# Patient Record
Sex: Male | Born: 1955 | Race: White | Hispanic: No | Marital: Married | Smoking: Former smoker
Health system: Southern US, Community
[De-identification: ages and names within clinical notes are randomized; demographics above are authoritative.]

## PROBLEM LIST (undated history)

## (undated) DIAGNOSIS — F32A Depression, unspecified: Secondary | ICD-10-CM

## (undated) DIAGNOSIS — Z9289 Personal history of other medical treatment: Secondary | ICD-10-CM

## (undated) DIAGNOSIS — E785 Hyperlipidemia, unspecified: Secondary | ICD-10-CM

## (undated) DIAGNOSIS — J45909 Unspecified asthma, uncomplicated: Secondary | ICD-10-CM

## (undated) DIAGNOSIS — F329 Major depressive disorder, single episode, unspecified: Secondary | ICD-10-CM

## (undated) DIAGNOSIS — T7840XA Allergy, unspecified, initial encounter: Secondary | ICD-10-CM

## (undated) DIAGNOSIS — K219 Gastro-esophageal reflux disease without esophagitis: Secondary | ICD-10-CM

## (undated) HISTORY — DX: Major depressive disorder, single episode, unspecified: F32.9

## (undated) HISTORY — DX: Hyperlipidemia, unspecified: E78.5

## (undated) HISTORY — PX: VASECTOMY: SHX75

## (undated) HISTORY — PX: POLYPECTOMY: SHX149

## (undated) HISTORY — DX: Personal history of other medical treatment: Z92.89

## (undated) HISTORY — PX: COLONOSCOPY: SHX174

## (undated) HISTORY — DX: Unspecified asthma, uncomplicated: J45.909

## (undated) HISTORY — DX: Gastro-esophageal reflux disease without esophagitis: K21.9

## (undated) HISTORY — PX: FRACTURE SURGERY: SHX138

## (undated) HISTORY — DX: Depression, unspecified: F32.A

## (undated) HISTORY — PX: HERNIA REPAIR: SHX51

## (undated) HISTORY — DX: Allergy, unspecified, initial encounter: T78.40XA

---

## 1978-05-07 HISTORY — PX: PILONIDAL CYST EXCISION: SHX744

## 1997-09-13 ENCOUNTER — Ambulatory Visit (HOSPITAL_COMMUNITY): Admission: RE | Admit: 1997-09-13 | Discharge: 1997-09-13 | Payer: Self-pay | Admitting: Family Medicine

## 1999-06-07 ENCOUNTER — Ambulatory Visit (HOSPITAL_BASED_OUTPATIENT_CLINIC_OR_DEPARTMENT_OTHER): Admission: RE | Admit: 1999-06-07 | Discharge: 1999-06-07 | Payer: Self-pay | Admitting: General Surgery

## 2007-09-16 IMAGING — CT CT HEAD
2 series · 15 of 30 positions shown, 19 images · IV contrast (agent unspecified)
Comparison: none

______________________________________________________________
REICH, PARGNA 04/24/1948 GABRIEL, ANAROSA [DATE]

REASON FOR CONSULTATION: R/O DIVERTICULOSIS
____________________________________________
EXAM: ABDOMEN & PELVIS WITH CONTRAST
An enhanced CT scan was obtained from the hemidiaphragms to the pubic
symphysis. The liver and spleen appear intact with no focal low density
areas identified.
Both kidneys excrete the contrast material with no masses noted in
either kidney.
The adrenal glands appear intact.
No focal masses are noted of the pancreas.
No intra-abdominal masses are visualized.
On the pelvic portion of the exam, no masses are identified to suggest
abscesses. No dirtiness to the fat is noted to suggest any inflammatory
response such as with diverticulitis. There is also noted some soft
tissue densities in the adnexal regions bilaterally which could
represent ovaries. The patient is status post hysterectomy.
 Patient REICH, PARGNA [DATE]

[Series 2: without contrast · axial · non-contrast · 0.49mm/px · z∈[+121,+271]mm · 13 of 36 slices shown, 17 images]
[im 3/36  brain]
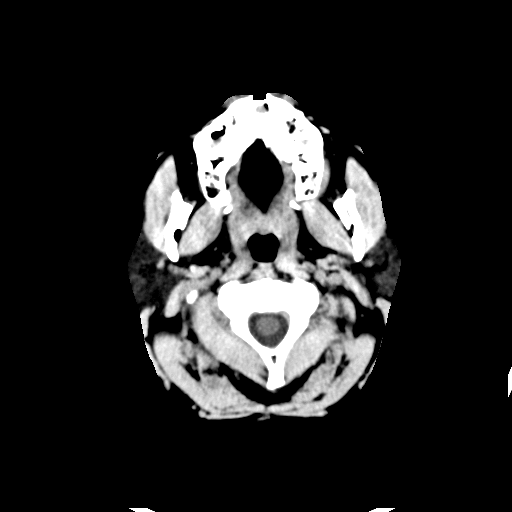
[im 3/36  bone]
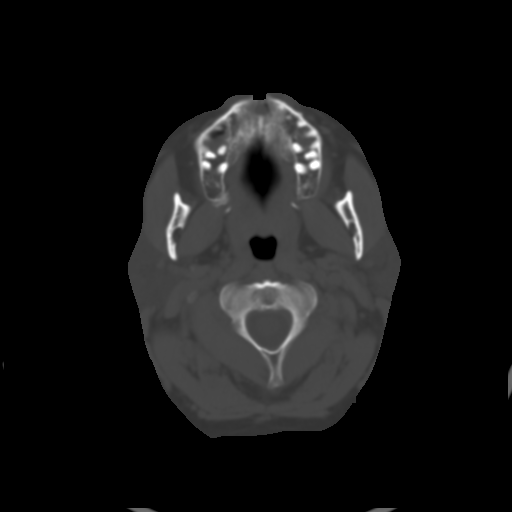
[im 6/36  brain]
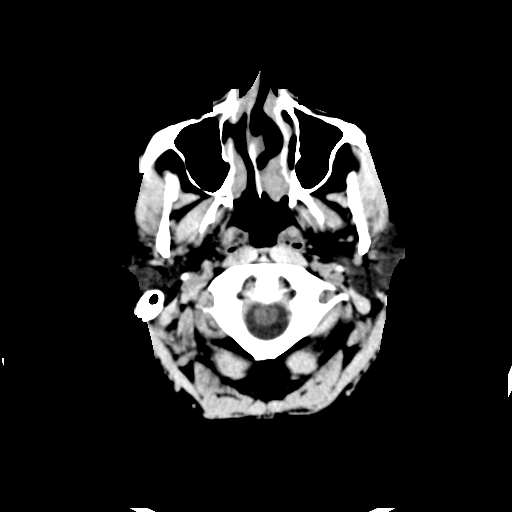
[im 8/36  brain]
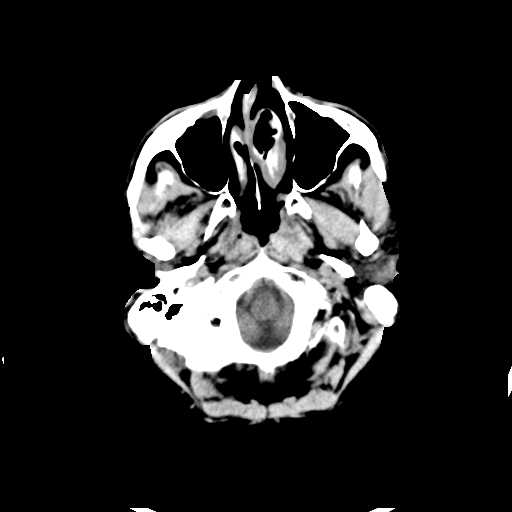
[im 11/36  brain]
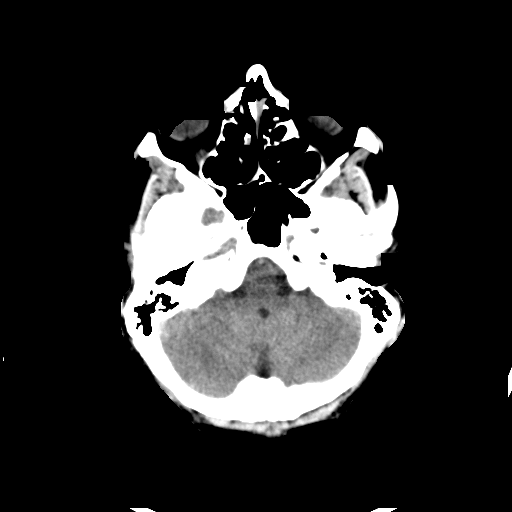
[im 13/36  brain]
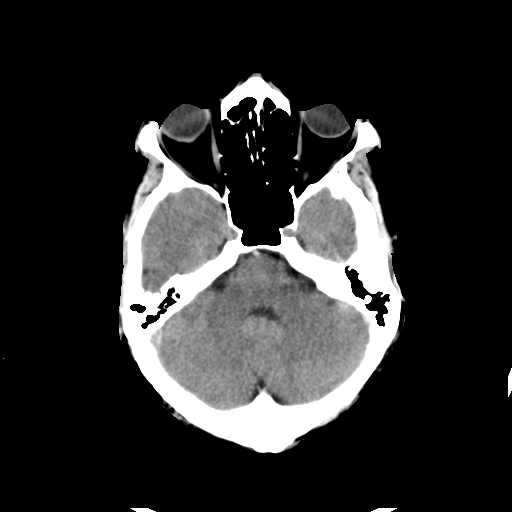
[im 13/36  bone]
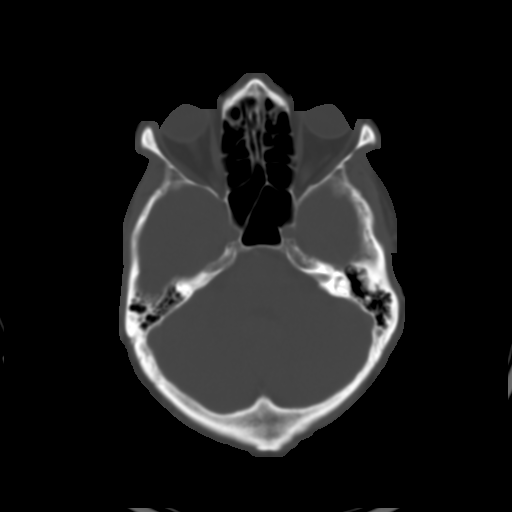
[im 16/36  brain]
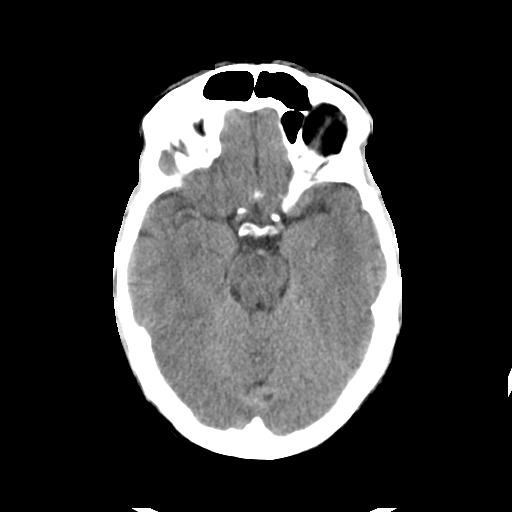
[im 18/36  brain]
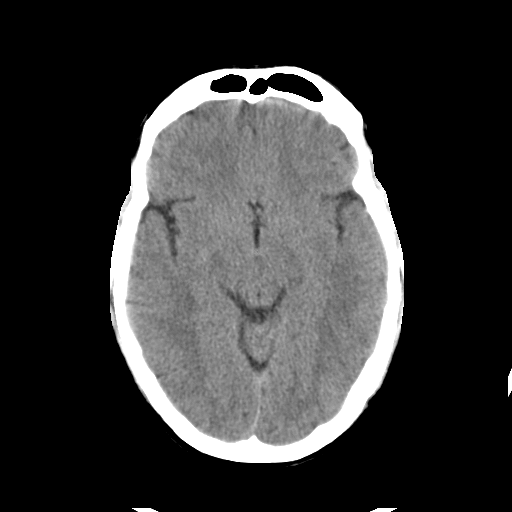
[im 21/36  brain]
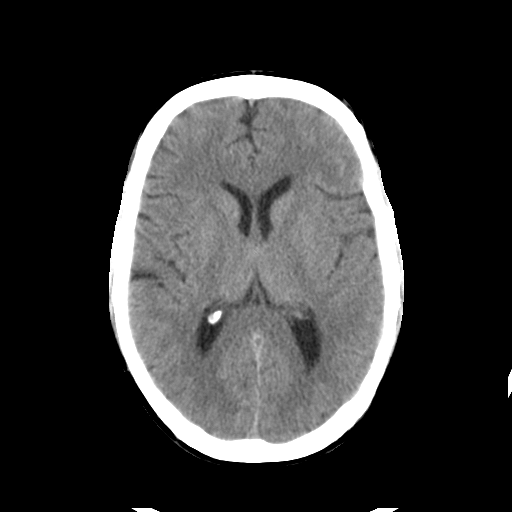
[im 23/36  brain]
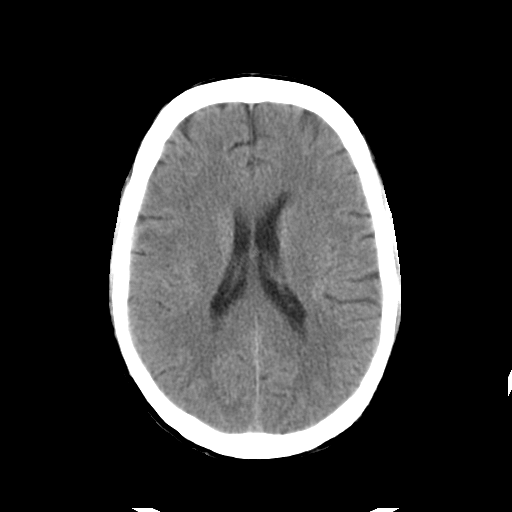
[im 23/36  bone]
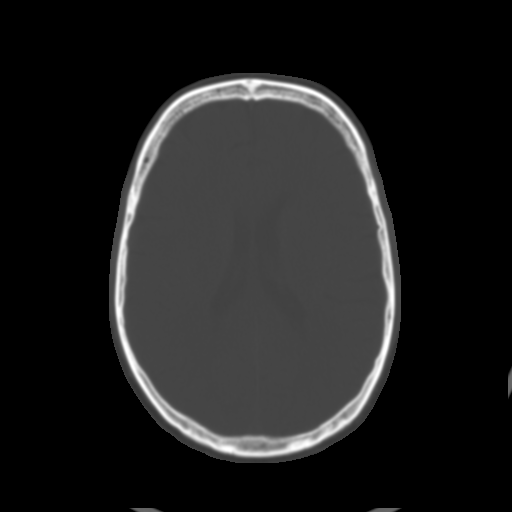
[im 26/36  brain]
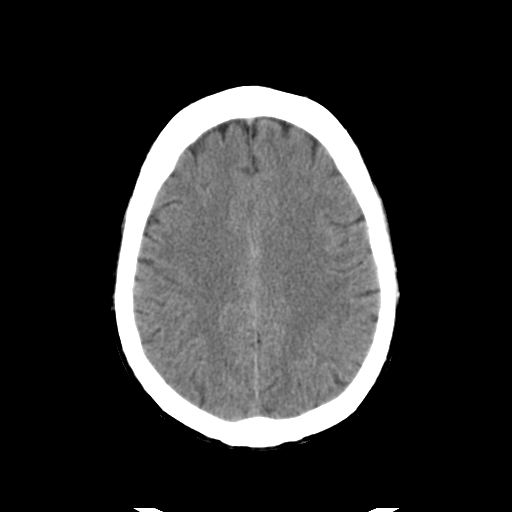
[im 28/36  brain]
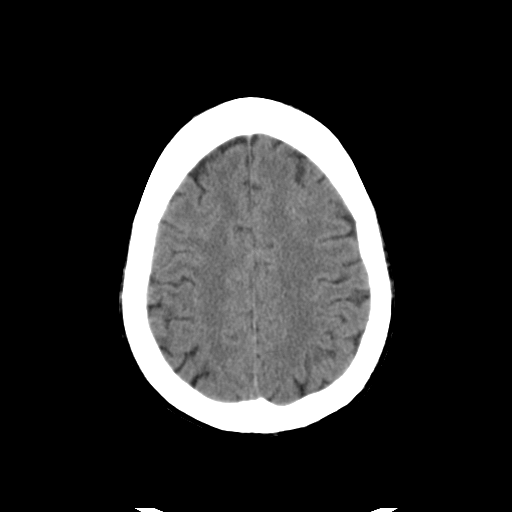
[im 31/36  brain]
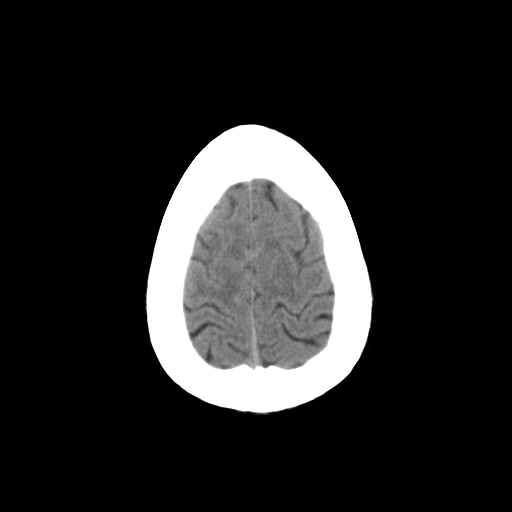
[im 33/36  brain]
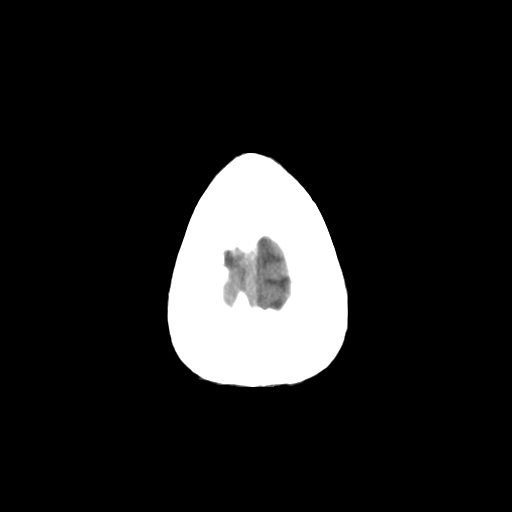
[im 33/36  bone]
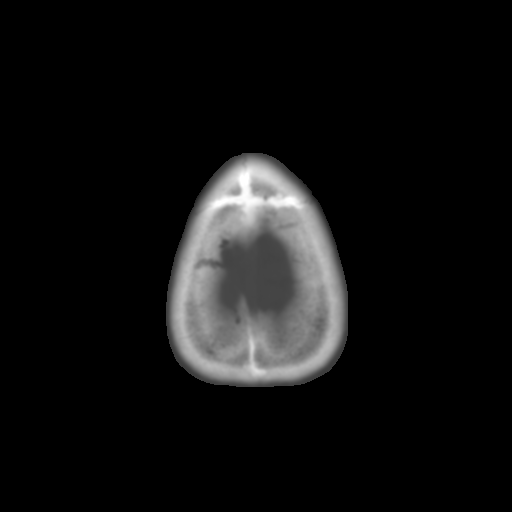

[Series 3: bone windows · axial · 0.49mm/px · z∈[+121,+146]mm · 2 of 36 slices shown]
[im 3/36  bone]
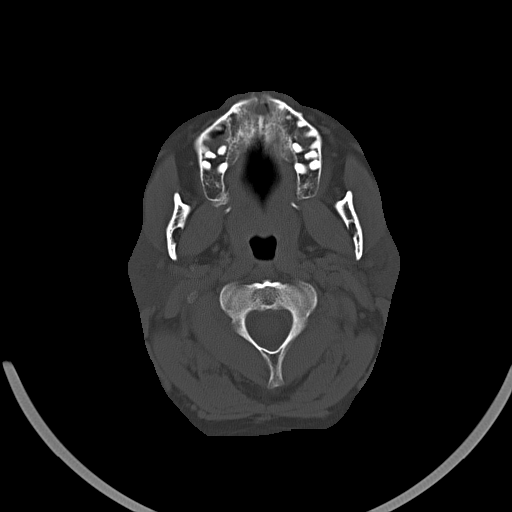
[im 8/36  bone]
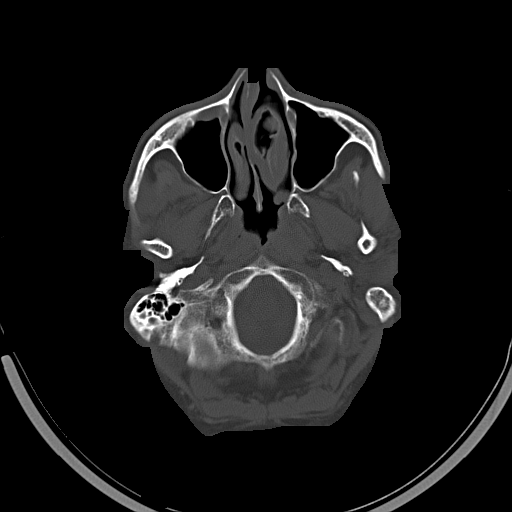

[15 of 30 positions shown; findings below may reference images not displayed]

IMPRESSION: 1. No definite abscesses or inflammatory response to suggest
 diverticulitis.

p>

  Patient REICH, PARGNA [DATE]

## 2012-01-29 ENCOUNTER — Ambulatory Visit: Payer: Self-pay | Admitting: Family Medicine

## 2012-01-29 VITALS — BP 102/78 | HR 63 | Temp 97.7°F | Resp 16 | Ht 68.0 in | Wt 177.8 lb

## 2012-01-29 DIAGNOSIS — Z23 Encounter for immunization: Secondary | ICD-10-CM

## 2012-01-29 DIAGNOSIS — J309 Allergic rhinitis, unspecified: Secondary | ICD-10-CM | POA: Insufficient documentation

## 2012-01-29 DIAGNOSIS — J45909 Unspecified asthma, uncomplicated: Secondary | ICD-10-CM | POA: Insufficient documentation

## 2012-01-29 MED ORDER — ALBUTEROL SULFATE HFA 108 (90 BASE) MCG/ACT IN AERS: 2.0000 | INHALATION_SPRAY | RESPIRATORY_TRACT | Status: DC | PRN

## 2012-01-29 MED ORDER — BUDESONIDE 180 MCG/ACT IN AEPB: 2.0000 | INHALATION_SPRAY | Freq: Two times a day (BID) | RESPIRATORY_TRACT | Status: DC

## 2012-01-29 NOTE — Progress Notes (Signed)
Subjective: Patient has a long history of asthma. He is generally fairly stable as long as he uses his medicines faithfully. Some years he doesn't need the medicines as much of except for in the spring and fall, but this series had problems all summer. He also has allergic rhinitis which has caused him to have to take more Zyrtec this year. He had some problems and is using some cromolyn drops for that. Generally he is stable and does well. 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 He is accompanied by his wife today. He has no other major acute complaints today. He apparently uses medicines faithfully.  Objective: Alert oriented male in no acute distress. TMs are normal. Throat clear. Neck supple without significant nodes. Chest is clear to auscultation. Mild prolonged expiratory phase. Heart was regular without murmurs gallops or arrhythmias.  Assessment: Asthma Allergic rhinitis Allergic conjunctivitis  Plan: Refill his medications. See him back in a year.  Flu shot

## 2012-01-29 NOTE — Patient Instructions (Signed)
Return in one year  Flu shot today

## 2013-02-25 ENCOUNTER — Other Ambulatory Visit: Payer: Self-pay | Admitting: Family Medicine

## 2013-06-24 ENCOUNTER — Ambulatory Visit (INDEPENDENT_AMBULATORY_CARE_PROVIDER_SITE_OTHER): Payer: BC Managed Care – PPO | Admitting: Emergency Medicine

## 2013-06-24 VITALS — BP 110/70 | HR 79 | Temp 98.0°F | Resp 16 | Ht 67.0 in | Wt 177.0 lb

## 2013-06-24 DIAGNOSIS — J45909 Unspecified asthma, uncomplicated: Secondary | ICD-10-CM

## 2013-06-24 MED ORDER — ALBUTEROL SULFATE HFA 108 (90 BASE) MCG/ACT IN AERS: INHALATION_SPRAY | RESPIRATORY_TRACT | Status: DC

## 2013-06-24 MED ORDER — BUDESONIDE 180 MCG/ACT IN AEPB: INHALATION_SPRAY | RESPIRATORY_TRACT | Status: DC

## 2013-06-24 NOTE — Patient Instructions (Signed)
Please consider make an appointment for a regular physical sometime in the near future

## 2013-06-24 NOTE — Progress Notes (Signed)
Subjective:    Patient ID: Blake Nash, male    DOB: April 11, 1956, 58 y.o.   MRN: [id number]  This chart was scribed for Blake Queen, MD by Maree Erie, ED Scribe.   Chief Complaint  Patient presents with   Medication Refill    ventolin    PCP: Default, Provider, MD   HPI  Blake Nash is a 58 y.o. male with a history of ashtma who presents to office for a medication refill on his Ventolin. He states that his triggers are cold weather, strenuous exercise or combination with allergies. He states that the asthma is usually worst in the spring and sometimes in the winter after eating holiday food. He was a smoker when he was much younger but quit at 71. He works as a Geologist, engineering.   Peak flow is 450.  Patient Active Problem List   Diagnosis Date Noted   Asthma 01/29/2012   Allergic rhinitis 01/29/2012   Past Medical History  Diagnosis Date   Asthma    Allergy    Depression    Past Surgical History  Procedure Laterality Date   Fracture surgery     Hernia repair     Vasectomy     No Known Allergies Prior to Admission medications   Medication Sig Start Date End Date Taking? Authorizing Provider  albuterol (VENTOLIN HFA) 108 (90 BASE) MCG/ACT inhaler Inhale 2 puffs into the lungs every 6 (six) hours as needed for wheezing. PATIENT NEEDS OFFICE VISIT FOR ADDITIONAL REFILLS 02/25/13  Yes Eleanore E Egan, PA-C  budesonide (PULMICORT FLEXHALER) 180 MCG/ACT inhaler Inhale 2 puffs into the lungs 2 (two) times daily. PATIENT NEEDS OFFICE VISIT FOR ADDITIONAL REFILLS 02/25/13  Yes Eleanore Kurtis Bushman, PA-C  cromolyn (OPTICROM) 4 % ophthalmic solution Place 1 drop into both eyes 4 (four) times daily.    Historical Provider, MD   History   Social History   Marital Status: Married    Spouse Name: N/A    Number of Children: N/A   Years of Education: N/A   Occupational History   Not on file.   Social History Main Topics   Smoking status: Never Smoker    Smokeless  tobacco: Not on file   Alcohol Use: Not on file   Drug Use: Not on file   Sexual Activity: Not on file   Other Topics Concern   Not on file   Social History Narrative   No narrative on file    Review of Systems Review of Systems: Consitutional: No fever, chills, fatigue, night sweats, lymphadenopathy, or weight changes. Eyes: No visual changes, eye redness, or discharge. ENT/Mouth: Ears: No otalgia, tinnitus, hearing loss, discharge. Nose: No congestion, rhinorrhea, sinus pain, or epistaxis. Throat: No sore throat, post nasal drip, or teeth pain. Cardiovascular: No CP, palpitations, diaphoresis, DOE, edema, orthopnea, PND. Respiratory: No cough, hemoptysis, SOB, or wheezing. Gastrointestinal: No anorexia, dysphagia, reflux, pain, nausea, vomiting, hematemesis, diarrhea, constipation, BRBPR, or melena. Genitourinary: No dysuria, frequency, urgency, hematuria, incontinence, nocturia, decreased urinary stream, discharge, impotence, or testicular pain/masses. Musculoskeletal: No decreased ROM, myalgias, stiffness, joint swelling, or weakness. Skin: No rash, erythema, lesion changes, pain, warmth, jaundice, or pruritis. Neurological: No headache, dizziness, syncope, seizures, tremors, memory loss, coordination problems, or paresthesias. Psychological: No anxiety, depression, hallucinations, SI/HI. Endocrine: No fatigue, polydipsia, polyphagia, polyuria, or known diabetes. All other systems were reviewed and are otherwise negative.      Objective:   Physical Exam General: Well-developed, well-nourished male in no acute  distress; appearance consistent with age of record HENT: normocephalic; atraumatic Eyes: pupils equal, round and reactive to light; extraocular muscles intact Neck: supple Heart: regular rate and rhythm; no murmurs, rubs or gallops Lungs: clear to auscultation bilaterally Abdomen: soft; nondistended; nontender; no masses or hepatosplenomegaly; bowel sounds  present Extremities: No deformity; full range of motion; pulses normal Neurologic: Awake, alert and oriented; motor function intact in all extremities and symmetric; no facial droop Skin: Warm and dry Psychiatric: Normal mood and affect        Assessment & Plan:  Patient stable with his asthma. His peak flow was 450 which is good for him. He is nonsmoker. Meds were refilled.   I personally performed the services described in this documentation, which was scribed in my presence. The recorded information has been reviewed and is accurate.  **Disclaimer: This note was dictated with voice recognition software. Similar sounding words can inadvertently be transcribed and this note may contain transcription errors which may not have been corrected upon publication of note.**

## 2014-07-11 ENCOUNTER — Other Ambulatory Visit: Payer: Self-pay | Admitting: Emergency Medicine

## 2014-08-03 ENCOUNTER — Other Ambulatory Visit: Payer: Self-pay | Admitting: Physician Assistant

## 2014-09-13 ENCOUNTER — Ambulatory Visit (INDEPENDENT_AMBULATORY_CARE_PROVIDER_SITE_OTHER): Payer: 59 | Admitting: Internal Medicine

## 2014-09-13 VITALS — BP 118/66 | HR 65 | Temp 98.1°F | Resp 18 | Ht 68.0 in | Wt 175.0 lb

## 2014-09-13 DIAGNOSIS — J4521 Mild intermittent asthma with (acute) exacerbation: Secondary | ICD-10-CM

## 2014-09-13 MED ORDER — PREDNISONE 20 MG PO TABS: ORAL_TABLET | ORAL | Status: DC

## 2014-09-13 MED ORDER — HYDROCODONE-HOMATROPINE 5-1.5 MG/5ML PO SYRP: 5.0000 mL | ORAL_SOLUTION | Freq: Four times a day (QID) | ORAL | Status: DC | PRN

## 2014-09-13 MED ORDER — AZITHROMYCIN 250 MG PO TABS: ORAL_TABLET | ORAL | Status: DC

## 2014-09-13 NOTE — Progress Notes (Signed)
   Subjective:    Patient ID: Blake Nash, male    DOB: 06/22/1955, 59 y.o.   MRN: [id number] This chart was scribed for Blake Lin, MD by Zola Button, Medical Scribe. This patient was seen in Room 12 and the patient's care was started at 2:15 PM.   HPI HPI Comments: Blake Nash is a 60 y.o. male with a hx of asthma and allergic rhinitis who presents to the [facility] complaining of gradual onset URI symptoms that started about 1 week ago. Patient believes he has bronchitis. He reports having productive cough, chills, generalized myalgias, and sore throat. His symptoms started with sore throat. He has been taking Nyquil but without relief. Patient has had bronchitis in the past. He is on Ventolin and Pulmicort for asthma.  Review of Systems  Constitutional: Positive for chills.  HENT: Positive for sore throat.   Respiratory: Positive for cough.   Musculoskeletal: Positive for myalgias.       Objective:   Physical Exam  Constitutional: He is oriented to person, place, and time. He appears well-developed and well-nourished. No distress.  HENT:  Head: Normocephalic and atraumatic.  Nose: Nose normal.  Mouth/Throat: Oropharynx is clear and moist. No oropharyngeal exudate.  Eyes: Conjunctivae and EOM are normal. Pupils are equal, round, and reactive to light.  Neck: Neck supple.  Cardiovascular: Normal rate, regular rhythm and normal heart sounds.   Pulmonary/Chest: Effort normal. He has wheezes.  Wheezing with forced expiration bilaterally.  Musculoskeletal: He exhibits no edema.  Lymphadenopathy:    He has no cervical adenopathy.  Neurological: He is alert and oriented to person, place, and time. No cranial nerve deficit.  Skin: Skin is warm and dry. No rash noted.  Psychiatric: He has a normal mood and affect. His behavior is normal.  Vitals reviewed.         Assessment & Plan:  Asthma with acute exacerbation, mild intermittent  Meds ordered  this encounter  Medications  . azithromycin (ZITHROMAX) 250 MG tablet    Sig: As packaged    Dispense:  6 tablet    Refill:  0  . predniSONE (DELTASONE) 20 MG tablet    Sig: 3/3/2/2/1/1 single daily dose for 6 days    Dispense:  12 tablet    Refill:  0  . HYDROcodone-homatropine (HYCODAN) 5-1.5 MG/5ML syrup    Sig: Take 5 mLs by mouth every 6 (six) hours as needed.    Dispense:  120 mL    Refill:  0   continue routine inhalers-Ventolin and Pulmicort Follow-up one week if not controlled  I have completed the patient encounter in its entirety as documented by the scribe, with editing by me where necessary. Blake Nash, M.D.

## 2014-12-17 ENCOUNTER — Ambulatory Visit (INDEPENDENT_AMBULATORY_CARE_PROVIDER_SITE_OTHER): Payer: 59 | Admitting: Family Medicine

## 2014-12-17 VITALS — BP 108/70 | HR 57 | Temp 97.5°F | Resp 14 | Ht 68.0 in | Wt 180.0 lb

## 2014-12-17 DIAGNOSIS — H1013 Acute atopic conjunctivitis, bilateral: Secondary | ICD-10-CM

## 2014-12-17 DIAGNOSIS — J302 Other seasonal allergic rhinitis: Secondary | ICD-10-CM | POA: Diagnosis not present

## 2014-12-17 DIAGNOSIS — J4530 Mild persistent asthma, uncomplicated: Secondary | ICD-10-CM

## 2014-12-17 MED ORDER — BUDESONIDE 180 MCG/ACT IN AEPB: INHALATION_SPRAY | RESPIRATORY_TRACT | Status: DC

## 2014-12-17 MED ORDER — ALBUTEROL SULFATE HFA 108 (90 BASE) MCG/ACT IN AERS: 2.0000 | INHALATION_SPRAY | Freq: Four times a day (QID) | RESPIRATORY_TRACT | Status: DC | PRN

## 2014-12-17 NOTE — Patient Instructions (Signed)
1. RECOMMEND RETURNING FOR COMPLETE PHYSICAL EXAMINATION.    Asthma Attack Prevention Although there is no way to prevent asthma from starting, you can take steps to control the disease and reduce its symptoms. Learn about your asthma and how to control it. Take an active role to control your asthma by working with your health care provider to create and follow an asthma action plan. An asthma action plan guides you in:  Taking your medicines properly.  Avoiding things that set off your asthma or make your asthma worse (asthma triggers).  Tracking your level of asthma control.  Responding to worsening asthma.  Seeking emergency care when needed. To track your asthma, keep records of your symptoms, check your peak flow number using a handheld device that shows how well air moves out of your lungs (peak flow meter), and get regular asthma checkups.  WHAT ARE SOME WAYS TO PREVENT AN ASTHMA ATTACK?  Take medicines as directed by your health care provider.  Keep track of your asthma symptoms and level of control.  With your health care provider, write a detailed plan for taking medicines and managing an asthma attack. Then be sure to follow your action plan. Asthma is an ongoing condition that needs regular monitoring and treatment.  Identify and avoid asthma triggers. Many outdoor allergens and irritants (such as pollen, mold, cold air, and air pollution) can trigger asthma attacks. Find out what your asthma triggers are and take steps to avoid them.  Monitor your breathing. Learn to recognize warning signs of an attack, such as coughing, wheezing, or shortness of breath. Your lung function may decrease before you notice any signs or symptoms, so regularly measure and record your peak airflow with a home peak flow meter.  Identify and treat attacks early. If you act quickly, you are less likely to have a severe attack. You will also need less medicine to control your symptoms. When your peak  flow measurements decrease and alert you to an upcoming attack, take your medicine as instructed and immediately stop any activity that may have triggered the attack. If your symptoms do not improve, get medical help.  Pay attention to increasing quick-relief inhaler use. If you find yourself relying on your quick-relief inhaler, your asthma is not under control. See your health care provider about adjusting your treatment. WHAT CAN MAKE MY SYMPTOMS WORSE? A number of common things can set off or make your asthma symptoms worse and cause temporary increased inflammation of your airways. Keep track of your asthma symptoms for several weeks, detailing all the environmental and emotional factors that are linked with your asthma. When you have an asthma attack, go back to your asthma diary to see which factor, or combination of factors, might have contributed to it. Once you know what these factors are, you can take steps to control many of them. If you have allergies and asthma, it is important to take asthma prevention steps at home. Minimizing contact with the substance to which you are allergic will help prevent an asthma attack. Some triggers and ways to avoid these triggers are: Animal Dander:  Some people are allergic to the flakes of skin or dried saliva from animals with fur or feathers.   There is no such thing as a hypoallergenic dog or cat breed. All dogs or cats can cause allergies, even if they don't shed.  Keep these pets out of your home.  If you are not able to keep a pet outdoors, keep the pet  out of your bedroom and other sleeping areas at all times, and keep the door closed.  Remove carpets and furniture covered with cloth from your home. If that is not possible, keep the pet away from fabric-covered furniture and carpets. Dust Mites: Many people with asthma are allergic to dust mites. Dust mites are tiny bugs that are found in every home in mattresses, pillows, carpets,  fabric-covered furniture, bedcovers, clothes, stuffed toys, and other fabric-covered items.   Cover your mattress in a special dust-proof cover.  Cover your pillow in a special dust-proof cover, or wash the pillow each week in hot water. Water must be hotter than 130 F (54.4 C) to kill dust mites. Cold or warm water used with detergent and bleach can also be effective.  Wash the sheets and blankets on your bed each week in hot water.  Try not to sleep or lie on cloth-covered cushions.  Call ahead when traveling and ask for a smoke-free hotel room. Bring your own bedding and pillows in case the hotel only supplies feather pillows and down comforters, which may contain dust mites and cause asthma symptoms.  Remove carpets from your bedroom and those laid on concrete, if you can.  Keep stuffed toys out of the bed, or wash the toys weekly in hot water or cooler water with detergent and bleach. Cockroaches: Many people with asthma are allergic to the droppings and remains of cockroaches.   Keep food and garbage in closed containers. Never leave food out.  Use poison baits, traps, powders, gels, or paste (for example, boric acid).  If a spray is used to kill cockroaches, stay out of the room until the odor goes away. Indoor Mold:  Fix leaky faucets, pipes, or other sources of water that have mold around them.  Clean floors and moldy surfaces with a fungicide or diluted bleach.  Avoid using humidifiers, vaporizers, or swamp coolers. These can spread molds through the air. Pollen and Outdoor Mold:  When pollen or mold spore counts are high, try to keep your windows closed.  Stay indoors with windows closed from late morning to afternoon. Pollen and some mold spore counts are highest at that time.  Ask your health care provider whether you need to take anti-inflammatory medicine or increase your dose of the medicine before your allergy season starts. Other Irritants to Avoid:  Tobacco  smoke is an irritant. If you smoke, ask your health care provider how you can quit. Ask family members to quit smoking, too. Do not allow smoking in your home or car.  If possible, do not use a wood-burning stove, kerosene heater, or fireplace. Minimize exposure to all sources of smoke, including incense, candles, fires, and fireworks.  Try to stay away from strong odors and sprays, such as perfume, talcum powder, hair spray, and paints.  Decrease humidity in your home and use an indoor air cleaning device. Reduce indoor humidity to below 60%. Dehumidifiers or central air conditioners can do this.  Decrease house dust exposure by changing furnace and air cooler filters frequently.  Try to have someone else vacuum for you once or twice a week. Stay out of rooms while they are being vacuumed and for a short while afterward.  If you vacuum, use a dust mask from a hardware store, a double-layered or microfilter vacuum cleaner bag, or a vacuum cleaner with a HEPA filter.  Sulfites in foods and beverages can be irritants. Do not drink beer or wine or eat dried fruit, processed  potatoes, or shrimp if they cause asthma symptoms.  Cold air can trigger an asthma attack. Cover your nose and mouth with a scarf on cold or windy days.  Several health conditions can make asthma more difficult to manage, including a runny nose, sinus infections, reflux disease, psychological stress, and sleep apnea. Work with your health care provider to manage these conditions.  Avoid close contact with people who have a respiratory infection such as a cold or the flu, since your asthma symptoms may get worse if you catch the infection. Wash your hands thoroughly after touching items that may have been handled by people with a respiratory infection.  Get a flu shot every year to protect against the flu virus, which often makes asthma worse for days or weeks. Also get a pneumonia shot if you have not previously had one. Unlike  the flu shot, the pneumonia shot does not need to be given yearly. Medicines:  Talk to your health care provider about whether it is safe for you to take aspirin or non-steroidal anti-inflammatory medicines (NSAIDs). In a small number of people with asthma, aspirin and NSAIDs can cause asthma attacks. These medicines must be avoided by people who have known aspirin-sensitive asthma. It is important that people with aspirin-sensitive asthma read labels of all over-the-counter medicines used to treat pain, colds, coughs, and fever.  Beta-blockers and ACE inhibitors are other medicines you should discuss with your health care provider. HOW CAN I FIND OUT WHAT I AM ALLERGIC TO? Ask your asthma health care provider about allergy skin testing or blood testing (the RAST test) to identify the allergens to which you are sensitive. If you are found to have allergies, the most important thing to do is to try to avoid exposure to any allergens that you are sensitive to as much as possible. Other treatments for allergies, such as medicines and allergy shots (immunotherapy) are available.  CAN I EXERCISE? Follow your health care provider's advice regarding asthma treatment before exercising. It is important to maintain a regular exercise program, but vigorous exercise or exercise in cold, humid, or dry environments can cause asthma attacks, especially for those people who have exercise-induced asthma. Document Released: 04/11/2009 Document Revised: 04/28/2013 Document Reviewed: 10/29/2012 [facility] Patient Information 2015 [place], [place]. This information is not intended to replace advice given to you by your health care provider. Make sure you discuss any questions you have with your health care provider.

## 2014-12-17 NOTE — Progress Notes (Signed)
Subjective:  This chart was scribed for Reginia Forts, MD by Thea Alken, ED Scribe. This patient was seen in room 11 and the patient's care was started at 10:09 AM.   Patient ID: Blake Nash, male    DOB: July 11, 1955, 59 y.o.   MRN: [id number]  HPI   Chief Complaint  Patient presents with  . Medication Refill    Ventolin and Pulmicort   HPI Comments: Blake Nash is a 59 y.o. male who presents to the [facility] for medication refill.  Pt has hx of asthma and allergies. He uses 2 puffs of pulmicort every day. States he occasionally uses rescue inhaler when he forgets to take pulmicort in there morning and is in rush to walk his dog. He will take zyrtec as needed for his allergies. He also limits exposure outside, does not drink soda and watches what he eats to limit allergies and other health problems.  Hx of surgery including fractured foot, hernia repair, and vasectomy.  He has hx of depression. Has not had colonoscopy or physical.  Mother passed at 43 with hx of schizophrenia. Father passed at 45 in an accident with hx of MI and asthma. Pt has sibling and states they are healthy. have also had MI's and bypasses. He believes their diet contributed to most of their chronic problems.  Pt is marred with children, and 2 grandchildren. He lives at home with his wife and dogs. He is currently taking care of his wife who has epileptic seizures, PTSD, bipolar disorder and memory loss. He is not retired. He is former smoker who quit at age 48.  He walks his dogs 3 times a day [place]. He denies CP and palpitation with walking dogs. He has occasional heartburn when he started taking probiotics recently.   Past Medical History  Diagnosis Date  . Asthma   . Allergy   . Depression    Prior to Admission medications   Medication Sig Start Date End Date Taking? Authorizing Provider  albuterol (VENTOLIN HFA) 108 (90 BASE) MCG/ACT inhaler Inhale 2 puffs into the  lungs every 4 (four) hours as needed. PATIENT NEEDS OFFICE VISIT FOR ADDITIONAL REFILLS 07/12/14  Yes Darlyne Russian, MD  budesonide (PULMICORT FLEXHALER) 180 MCG/ACT inhaler INHALE 2 PUFFS TWICE A DAY.  "OFFICE VISIT NEEDED FOR ADDITIONAL REFILLS" 07/11/14  Yes Chelle Jeffery, PA-C  cromolyn (OPTICROM) 4 % ophthalmic solution Place 1 drop into both eyes 4 (four) times daily.   Yes Historical Provider, MD   Social History   Social History  . Marital Status: Married    Spouse Name: N/A  . Number of Children: N/A  . Years of Education: N/A   Occupational History  . Not on file.   Social History Main Topics  . Smoking status: Never Smoker   . Smokeless tobacco: Not on file  . Alcohol Use: Not on file  . Drug Use: Not on file  . Sexual Activity: Not on file   Other Topics Concern  . Not on file   Social History Narrative   Marital status: married      Children: 2 children; +grandchildren 4      Lives:  With wife.      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      Review of Systems  Constitutional: Negative for fever, chills, diaphoresis, activity change, appetite change and fatigue.  Respiratory: Positive for wheezing. Negative for cough and shortness of breath.   Cardiovascular: Negative for chest pain, palpitations and leg swelling.  Gastrointestinal: Negative for nausea, vomiting, abdominal pain and diarrhea.  Endocrine: Negative for cold intolerance, heat intolerance, polydipsia, polyphagia and polyuria.  Skin: Negative for color change, rash and wound.  Allergic/Immunologic: Positive for environmental allergies.  Neurological: Negative for dizziness, tremors, seizures, syncope, facial asymmetry, speech difficulty, weakness, light-headedness, numbness and headaches.  Psychiatric/Behavioral: Negative for sleep disturbance  and dysphoric mood. The patient is not nervous/anxious.     Objective:   Physical Exam  Constitutional: He is oriented to person, place, and time. He appears well-developed and well-nourished. No distress.  HENT:  Head: Normocephalic and atraumatic.  Right Ear: External ear normal.  Left Ear: External ear normal.  Nose: Nose normal.  Mouth/Throat: Oropharynx is clear and moist.  Eyes: Conjunctivae and EOM are normal. Pupils are equal, round, and reactive to light.  Neck: Normal range of motion. Neck supple. Carotid bruit is not present. No thyromegaly present.  Cardiovascular: Normal rate, regular rhythm, normal heart sounds and intact distal pulses.  Exam reveals no gallop and no friction rub.   No murmur heard. Pulmonary/Chest: Effort normal and breath sounds normal. He has no wheezes. He has no rales.  Abdominal: Soft. Bowel sounds are normal. He exhibits no distension and no mass. There is no tenderness. There is no rebound and no guarding.  Musculoskeletal: Normal range of motion.  Lymphadenopathy:    He has no cervical adenopathy.  Neurological: He is alert and oriented to person, place, and time. No cranial nerve deficit.  Skin: Skin is warm and dry. No rash noted. He is not diaphoretic.  Psychiatric: He has a normal mood and affect. His behavior is normal.  Nursing note and vitals reviewed.    Filed Vitals:   12/17/14 0911  BP: 108/70  Pulse: 57  Temp: 97.5 F (36.4 C)  TempSrc: Oral  Resp: 14  Height: 5\' 8"  (1.727 m)  Weight: 180 lb (81.647 kg)  SpO2: 97%     Assessment & Plan:   1. Asthma, mild persistent, uncomplicated   2. Other seasonal allergic rhinitis   3. Allergic conjunctivitis, bilateral     1.  Asthma mild persistent: controlled; refill of Pulmicort provided; refill of Albuterol also provided; recommend flu vaccine annually. 2.  Allergic Rhinitis: controlled; continue Zyrtec daily; add Flonase PRN. 3.  Allergic Conjunctivitis: controlled; continue  current therapy. 4. Health maintenance: encourage annual physical; pt to consider.   Meds ordered this encounter  Medications  . budesonide (PULMICORT FLEXHALER) 180 MCG/ACT inhaler    Sig: INHALE 2 PUFFS TWICE A DAY.    Dispense:  1 each    Refill:  11  . albuterol (VENTOLIN HFA) 108 (90 BASE) MCG/ACT inhaler    Sig: Inhale 2 puffs into the lungs every 6 (six) hours as needed.    Dispense:  18 g    Refill:  4    I personally performed the services described in this documentation, which was scribed in my presence. The recorded information has been reviewed and considered.  Imari Sivertsen Elayne Guerin, M.D. [facility] [address] [place], [place]  [address] [phone] phone [phone] fax

## 2015-04-21 ENCOUNTER — Other Ambulatory Visit: Payer: Self-pay | Admitting: Family Medicine

## 2015-05-30 ENCOUNTER — Telehealth: Payer: Self-pay

## 2015-05-30 DIAGNOSIS — J4530 Mild persistent asthma, uncomplicated: Secondary | ICD-10-CM

## 2015-05-30 NOTE — Telephone Encounter (Signed)
PA completed on covermymeds for Pulmicort. I called pt to clarify what other respiratory meds he has tried in the past. He stated that he has only used the Ventolin and Pulmicort. PA pending.

## 2015-05-31 NOTE — Telephone Encounter (Signed)
[employer] - DENIED MEDICATION FOR PULMICORT -  TWO MEDS FLOVENT IS APPROVED WILL COVER PULMICORT AFTER HE TRIES    [phone]  EXT [id number]  CASE  [id number]

## 2015-06-01 MED ORDER — FLUTICASONE PROPIONATE HFA 110 MCG/ACT IN AERO: 2.0000 | INHALATION_SPRAY | Freq: Two times a day (BID) | RESPIRATORY_TRACT | Status: DC

## 2015-06-01 NOTE — Telephone Encounter (Signed)
Prescribed flovent 110 2 puffs bid.  Please advise patient of this and please try to get that prior authorization, as he does well on the pulmicort.

## 2015-06-01 NOTE — Telephone Encounter (Signed)
Can we change to Flovent?

## 2015-06-02 NOTE — Telephone Encounter (Signed)
PA was denied for Pulmicort. Both of covered alternatives have to be tried/failed bf Pulmicort can be approved: Asmanex and Flovent. Called pt to advise and he agreed to let me know if it does not work as well for him.

## 2015-12-15 ENCOUNTER — Encounter: Payer: Self-pay | Admitting: Family Medicine

## 2015-12-15 ENCOUNTER — Ambulatory Visit (INDEPENDENT_AMBULATORY_CARE_PROVIDER_SITE_OTHER): Payer: BLUE CROSS/BLUE SHIELD | Admitting: Family Medicine

## 2015-12-15 VITALS — BP 126/84 | HR 97 | Temp 98.6°F | Resp 18 | Ht 68.0 in | Wt 168.0 lb

## 2015-12-15 DIAGNOSIS — Z1211 Encounter for screening for malignant neoplasm of colon: Secondary | ICD-10-CM

## 2015-12-15 DIAGNOSIS — Z114 Encounter for screening for human immunodeficiency virus [HIV]: Secondary | ICD-10-CM | POA: Diagnosis not present

## 2015-12-15 DIAGNOSIS — Z23 Encounter for immunization: Secondary | ICD-10-CM

## 2015-12-15 DIAGNOSIS — H1010 Acute atopic conjunctivitis, unspecified eye: Secondary | ICD-10-CM

## 2015-12-15 DIAGNOSIS — Z1159 Encounter for screening for other viral diseases: Secondary | ICD-10-CM

## 2015-12-15 DIAGNOSIS — Z0000 Encounter for general adult medical examination without abnormal findings: Secondary | ICD-10-CM | POA: Diagnosis not present

## 2015-12-15 DIAGNOSIS — J4530 Mild persistent asthma, uncomplicated: Secondary | ICD-10-CM

## 2015-12-15 DIAGNOSIS — Z125 Encounter for screening for malignant neoplasm of prostate: Secondary | ICD-10-CM | POA: Diagnosis not present

## 2015-12-15 MED ORDER — FLUTICASONE PROPIONATE HFA 110 MCG/ACT IN AERO: 2.0000 | INHALATION_SPRAY | Freq: Two times a day (BID) | RESPIRATORY_TRACT | 5 refills | Status: DC

## 2015-12-15 MED ORDER — ALBUTEROL SULFATE HFA 108 (90 BASE) MCG/ACT IN AERS: INHALATION_SPRAY | RESPIRATORY_TRACT | 0 refills | Status: DC

## 2015-12-15 MED ORDER — ZOSTER VACCINE LIVE 19400 UNT/0.65ML ~~LOC~~ SUSR: 0.6500 mL | Freq: Once | SUBCUTANEOUS | 0 refills | Status: AC

## 2015-12-15 NOTE — Progress Notes (Signed)
By signing my name below, I, Blake Nash, attest that this documentation has been prepared under the direction and in the presence of Blake Ray, MD.  Electronically Signed: Verlee Nash, Medical Scribe. 12/15/15. 2:43 PM.  Subjective:    Patient ID: Blake Nash, male    DOB: Nov 15, 1955, 59 y.o.   MRN: [id number]  HPI Chief Complaint  Patient presents with  . Annual Exam    HPI Comments: Blake Nash is a 60 y.o. male with a PMHx of asthma, and allergic rhinitis who presents to the [facility] for his annual physical exam. Pt was last seen 12/17/14 by Dr. Tamala Julian for asthma, and allergies. Pt mentions he was put on medication for high cholesterol a few years back. Pt is fasting today.  Asthma: Takes Flovent 2 puffs BID in the evening for maintenance and Albuterol a couple of times in the month but no more than 2-3 times a week. Pt mentions his breathing is fine in the morning. Pt denies waking up in the middle of the night with trouble breathing. Pt mentions his asthma has been getting better since he's been using Flovent. Pt denies recent hospitalization from his asthma.  Eyes: Pt uses Opticrom PRN, occasionally.  Allergic Rhinitis: Takes Zyrtec and Flonase PRN. Pt mentions the changes in seasons trigger his allergies.  Cancer Screening: Prostate CA Screening:  Last screening has been over 10 years. FHx: Negative for prostate CA. No results found for: PSA Colon CA Screening: Pt has not had a colonoscopy yet.  Immunizations: Pt had shingles 10 years ago. Immunization History  Administered Date(s) Administered  . Influenza Split 01/29/2012   Vision: Pt wears glasses and saw his ophthalmologist last year.  Visual Acuity Screening   Right eye Left eye Both eyes  Without correction:     With correction: 20/20 20/20 20/20    Exercise: Pt walks 4 times a day with his dogs on his 17 acre plot and walks up a set of steps at his house for  exercise.  Dentist: Pt goes twice a year.  HIV/Hep C Screening: Pt has never been screened for HIV or Hep C but would like to get screened for both today.  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   Patient Active Problem List   Diagnosis Date Noted  . Asthma 01/29/2012  . Allergic rhinitis 01/29/2012   Past Medical History:  Diagnosis Date  . Allergy   . Asthma   . Depression    Past Surgical History:  Procedure Laterality Date  . Blake Nash    . Blake Nash    . Blake Nash     No Known Allergies Prior to Admission medications   Medication Sig Start Date End Date Taking? Authorizing Provider  albuterol (VENTOLIN HFA) 108 (90 BASE) MCG/ACT inhaler INHALE 2 PUFFS INTO THE LUNGS EVERY 6 HOURS AS NEEDED  "OFFICE VISIT NEEDED FOR REFILLS" 04/22/15   Wardell Honour, MD  budesonide [facility]) 180 MCG/ACT inhaler INHALE 2 PUFFS TWICE A DAY. 12/17/14   Wardell Honour, MD  cromolyn (OPTICROM) 4 % ophthalmic solution Place 1 drop into both eyes 4 (four) times daily.    Historical Provider, MD  fluticasone (FLOVENT HFA) 110 MCG/ACT inhaler Inhale 2 puffs into the lungs 2 (two) times daily. 06/01/15  Blake Bachelor, PA   Social History   Social History  . Marital status: Married    Spouse name: N/A  . Number of children: N/A  . Years of education: N/A   Occupational History  . Not on file.   Social History Main Topics  . Smoking status: Never Smoker  . Smokeless tobacco: Never Used  . Alcohol use Yes  . Drug use: No  . Sexual activity: Not on file   Other Topics Concern  . Not on file   Social History Narrative   Marital status: married      Children: 2 children; +grandchildren 4      Lives:  With wife.      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  Training on the farm on weekends.      Review of Systems  13 point ROS negative. Objective:  Physical Exam  Constitutional: He is oriented to person, place, and time. He appears well-developed and well-nourished.  HENT:  Head: Normocephalic and atraumatic.  Right Ear: External ear normal.  Left Ear: External ear normal.  Mouth/Throat: Oropharynx is clear and moist.  Eyes: Conjunctivae and EOM are normal. Pupils are equal, round, and reactive to light.  Neck: Normal range of motion. Neck supple. No thyromegaly present.  Cardiovascular: Normal rate, regular rhythm, normal heart sounds and intact distal pulses.   Pulmonary/Chest: Effort normal and breath sounds normal. No respiratory distress. He has no wheezes.  Abdominal: Soft. He exhibits no distension. There is no tenderness. Blake confirmed negative in the right inguinal area and confirmed negative in the left inguinal area.  Genitourinary: Prostate normal.  Musculoskeletal: Normal range of motion. He exhibits no edema or tenderness.  Lymphadenopathy:    He has no cervical adenopathy.  Neurological: He is alert and oriented to person, place, and time. He has normal reflexes.  Skin: Skin is warm and dry.  Psychiatric: He has a normal mood and affect. His behavior is normal.  Vitals reviewed.  BP 126/84 (BP Location: Right Arm, Patient Position: Sitting, Cuff Size: Small)   Pulse 97   Temp 98.6 F (37 C) (Oral)   Resp 18   Ht 5\' 8"  (1.727 m)   Wt 168 lb (76.2 kg)   SpO2 97%   BMI 25.54 kg/m   Assessment & Plan:   Blake Nash is a 60 y.o. male Annual physical exam - Plan: COMPLETE METABOLIC PANEL WITH GFR, Lipid panel  --anticipatory guidance as below in AVS, screening labs above. Health maintenance items as above in HPI discussed/recommended as applicable.   Asthma, mild persistent, uncomplicated - Plan: fluticasone (FLOVENT HFA) 110 MCG/ACT  inhaler  -Stable. No change in medications for now.  Screening for prostate cancer - Plan: PSA  -We discussed pros and cons of prostate cancer screening, and after this discussion, he chose to have screening done. PSA obtained, and no concerning findings on DRE.   Screen for colon cancer - Plan: Ambulatory referral to Gastroenterology  Allergic conjunctivitis, unspecified laterality  -Stable. Continue symptomatic care   Screening for HIV (human immunodeficiency virus) - Plan: HIV antibody  Need for hepatitis C screening test - Plan: Hepatitis C antibody  Need for shingles vaccine - Plan: Zoster Vaccine Live, PF, (ZOSTAVAX) 91478 UNT/0.65ML injection  -Zostavax printed for him to  check and because of pharmacy. May want to wait till age 53 to have this given.  Meds ordered this encounter  Medications  . albuterol (VENTOLIN HFA) 108 (90 Base) MCG/ACT inhaler    Sig: INHALE 1-2 PUFFS INTO THE LUNGS EVERY 6 HOURS AS NEEDED    Dispense:  18 g    Refill:  0  . fluticasone (FLOVENT HFA) 110 MCG/ACT inhaler    Sig: Inhale 2 puffs into the lungs 2 (two) times daily.    Dispense:  1 Inhaler    Refill:  5  . Zoster Vaccine Live, PF, (ZOSTAVAX) 29562 UNT/0.65ML injection    Sig: Inject 19,400 Units into the skin once.    Dispense:  1 each    Refill:  0   Patient Instructions    You should receive results of your labs within 2 weeks, likely sooner through my chart. We will refer you for a colonoscopy. Check into the cost of the shingles vaccine, but I printed this for you if you would like to have it given. You may want to wait until age 48 to have this given to see if the insurance coverage changes.  No change in asthma treatment at this time. If you have need for albuterol more than 2-3 times per week, or waking up with symptoms at night, return to discuss changes in treatment.  Keeping you healthy  Get these tests  Blood pressure- Have your blood pressure checked once a year by your  healthcare provider.  Normal blood pressure is 120/80  Weight- Have your body mass index (BMI) calculated to screen for obesity.  BMI is a measure of body fat based on height and weight. You can also calculate your own BMI at [email or web address].[email or web address].  Cholesterol- Have your cholesterol checked every year.  Diabetes- Have your blood sugar checked regularly if you have high blood pressure, high cholesterol, have a family history of diabetes or if you are overweight.  Screening for Colon Cancer- Colonoscopy starting at age 23.  Screening may begin sooner depending on your family history and other health conditions. Follow up colonoscopy as directed by your Gastroenterologist.  Screening for Prostate Cancer- Both blood work (PSA) and a rectal exam help screen for Prostate Cancer.  Screening begins at age 66 with African-American men and at age 58 with Caucasian men.  Screening may begin sooner depending on your family history.  Take these medicines  Aspirin- One aspirin daily can help prevent Heart disease and Stroke.  Flu shot- Every fall.  Tetanus- Every 10 years.  Zostavax- Once after the age of 13 to prevent Shingles.  Pneumonia shot- Once after the age of 19; if you are younger than 80, ask your healthcare provider if you need a Pneumonia shot.  Take these steps  Don't smoke- If you do smoke, talk to your doctor about quitting.  For tips on how to quit, go to www.smokefree.gov or call 1-800-QUIT-NOW.  Be physically active- Exercise 5 days a week for at least 30 minutes.  If you are not already physically active start slow and gradually work up to 30 minutes of moderate physical activity.  Examples of moderate activity include walking briskly, mowing the yard, dancing, swimming, bicycling, etc.  Eat a healthy diet- Eat a variety of healthy food such as fruits, vegetables, low fat milk, low fat cheese, yogurt, lean meant, poultry, fish, beans, tofu, etc. For more information go to  www.thenutritionsource.org  Drink alcohol in moderation- Limit alcohol intake to less than two  drinks a day. Never drink and drive.  Dentist- Brush and Nash twice daily; visit your dentist twice a year.  Depression- Your emotional health is as important as your physical health. If you're feeling down, or losing interest in things you would normally enjoy please talk to your healthcare provider.  Eye exam- Visit your eye doctor every year.  Safe sex- If you may be exposed to a sexually transmitted infection, use a condom.  Seat belts- Seat belts can save your life; always wear one.  Smoke/Carbon Monoxide detectors- These detectors need to be installed on the appropriate level of your home.  Replace batteries at least once a year.  Skin cancer- When out in the sun, cover up and use sunscreen 15 SPF or higher.  Violence- If anyone is threatening you, please tell your healthcare provider.  Living Will/ Health care power of attorney- Speak with your healthcare provider and family.    IF you received an x-Nash today, you will receive an invoice from [facility]. Please contact [facility] at [phone] with questions or concerns regarding your invoice.   IF you received labwork today, you will receive an invoice from [employer]. Please contact [facility] at [phone] with questions or concerns regarding your invoice.   Our billing staff will not be able to assist you with questions regarding bills from these companies.  You will be contacted with the lab results as soon as they are available. The fastest way to get your results is to activate your My Chart account. Instructions are located on the last page of this paperwork. If you have not heard from [place] regarding the results in 2 weeks, please contact this office.       I personally performed the services described in this documentation, which was scribed in my presence. The recorded  information has been reviewed and considered, and addended by me as needed.   Signed,   Blake Ray, MD [facility].  12/16/15 10:34 PM

## 2015-12-15 NOTE — Patient Instructions (Addendum)
You should receive results of your labs within 2 weeks, likely sooner through my chart. We will refer you for a colonoscopy. Check into the cost of the shingles vaccine, but I printed this for you if you would like to have it given. You may want to wait until age 60 to have this given to see if the insurance coverage changes.  No change in asthma treatment at this time. If you have need for albuterol more than 2-3 times per week, or waking up with symptoms at night, return to discuss changes in treatment.  Keeping you healthy  Get these tests  Blood pressure- Have your blood pressure checked once a year by your healthcare provider.  Normal blood pressure is 120/80  Weight- Have your body mass index (BMI) calculated to screen for obesity.  BMI is a measure of body fat based on height and weight. You can also calculate your own BMI at ViewBanking.si.  Cholesterol- Have your cholesterol checked every year.  Diabetes- Have your blood sugar checked regularly if you have high blood pressure, high cholesterol, have a family history of diabetes or if you are overweight.  Screening for Colon Cancer- Colonoscopy starting at age 48.  Screening may begin sooner depending on your family history and other health conditions. Follow up colonoscopy as directed by your Gastroenterologist.  Screening for Prostate Cancer- Both blood work (PSA) and a rectal exam help screen for Prostate Cancer.  Screening begins at age 21 with African-American men and at age 26 with Caucasian men.  Screening may begin sooner depending on your family history.  Take these medicines  Aspirin- One aspirin daily can help prevent Heart disease and Stroke.  Flu shot- Every fall.  Tetanus- Every 10 years.  Zostavax- Once after the age of 46 to prevent Shingles.  Pneumonia shot- Once after the age of 44; if you are younger than 12, ask your healthcare provider if you need a Pneumonia shot.  Take these steps  Don't  smoke- If you do smoke, talk to your doctor about quitting.  For tips on how to quit, go to www.smokefree.gov or call 1-800-QUIT-NOW.  Be physically active- Exercise 5 days a week for at least 30 minutes.  If you are not already physically active start slow and gradually work up to 30 minutes of moderate physical activity.  Examples of moderate activity include walking briskly, mowing the yard, dancing, swimming, bicycling, etc.  Eat a healthy diet- Eat a variety of healthy food such as fruits, vegetables, low fat milk, low fat cheese, yogurt, lean meant, poultry, fish, beans, tofu, etc. For more information go to www.thenutritionsource.org  Drink alcohol in moderation- Limit alcohol intake to less than two drinks a day. Never drink and drive.  Dentist- Brush and floss twice daily; visit your dentist twice a year.  Depression- Your emotional health is as important as your physical health. If you're feeling down, or losing interest in things you would normally enjoy please talk to your healthcare provider.  Eye exam- Visit your eye doctor every year.  Safe sex- If you may be exposed to a sexually transmitted infection, use a condom.  Seat belts- Seat belts can save your life; always wear one.  Smoke/Carbon Monoxide detectors- These detectors need to be installed on the appropriate level of your home.  Replace batteries at least once a year.  Skin cancer- When out in the sun, cover up and use sunscreen 15 SPF or higher.  Violence- If anyone is threatening you, please  tell your healthcare provider.  Living Will/ Health care power of attorney- Speak with your healthcare provider and family.    IF you received an x-ray today, you will receive an invoice from [facility]iology. Please contact [facility]iology at [phone] with questions or concerns regarding your invoice.   IF you received labwork today, you will receive an invoice from [employer]. Please  contact [facility] at [phone] with questions or concerns regarding your invoice.   Our billing staff will not be able to assist you with questions regarding bills from these companies.  You will be contacted with the lab results as soon as they are available. The fastest way to get your results is to activate your My Chart account. Instructions are located on the last page of this paperwork. If you have not heard from [place] regarding the results in 2 weeks, please contact this office.

## 2015-12-16 LAB — LIPID PANEL
CHOLESTEROL: 282 mg/dL — AB (ref 125–200)
HDL: 76 mg/dL (ref >=40)
LDL CALC: 193 mg/dL — AB (ref <130)
TRIGLYCERIDES: 66 mg/dL (ref <150)
Total CHOL/HDL Ratio: 3.7 Ratio (ref <=5.0)
VLDL: 13 mg/dL (ref <30)

## 2015-12-16 LAB — COMPLETE METABOLIC PANEL WITH GFR
ALK PHOS: 63 U/L (ref 40–115)
ALT: 19 U/L (ref 9–46)
AST: 24 U/L (ref 10–35)
Albumin: 4.5 g/dL (ref 3.6–5.1)
BILIRUBIN TOTAL: 0.6 mg/dL (ref 0.2–1.2)
BUN: 17 mg/dL (ref 7–25)
CALCIUM: 9.6 mg/dL (ref 8.6–10.3)
CO2: 25 mmol/L (ref 20–31)
CREATININE: 1.1 mg/dL (ref 0.70–1.33)
Chloride: 102 mmol/L (ref 98–110)
GFR, EST AFRICAN AMERICAN: 84 mL/min (ref >=60)
GFR, EST NON AFRICAN AMERICAN: 73 mL/min (ref >=60)
Glucose, Bld: 86 mg/dL (ref 65–99)
Potassium: 4.4 mmol/L (ref 3.5–5.3)
Sodium: 137 mmol/L (ref 135–146)
TOTAL PROTEIN: 7.2 g/dL (ref 6.1–8.1)

## 2015-12-16 LAB — PSA: PSA: 0.8 ng/mL (ref <=4.0)

## 2015-12-16 LAB — HIV ANTIBODY (ROUTINE TESTING W REFLEX): HIV 1&2 Ab, 4th Generation: NONREACTIVE

## 2015-12-16 LAB — HEPATITIS C ANTIBODY: HCV AB: NEGATIVE

## 2015-12-26 ENCOUNTER — Encounter: Payer: Self-pay | Admitting: Gastroenterology

## 2016-01-06 ENCOUNTER — Other Ambulatory Visit: Payer: Self-pay | Admitting: Family Medicine

## 2016-02-03 ENCOUNTER — Ambulatory Visit (AMBULATORY_SURGERY_CENTER): Payer: Self-pay | Admitting: *Deleted

## 2016-02-03 VITALS — Ht 68.0 in | Wt 179.0 lb

## 2016-02-03 DIAGNOSIS — Z1211 Encounter for screening for malignant neoplasm of colon: Secondary | ICD-10-CM

## 2016-02-03 MED ORDER — NA SULFATE-K SULFATE-MG SULF 17.5-3.13-1.6 GM/177ML PO SOLN: 1.0000 | Freq: Once | ORAL | 0 refills | Status: AC

## 2016-02-03 NOTE — Progress Notes (Signed)
Denies allergies to eggs or soy products. Denies complications with sedation or anesthesia. Denies O2 use. Denies use of diet or weight loss medications.  Emmi instructions given for colonoscopy.  

## 2016-02-06 ENCOUNTER — Encounter: Payer: Self-pay | Admitting: Gastroenterology

## 2016-02-17 ENCOUNTER — Encounter: Payer: Self-pay | Admitting: Gastroenterology

## 2016-02-17 ENCOUNTER — Ambulatory Visit (AMBULATORY_SURGERY_CENTER): Payer: BLUE CROSS/BLUE SHIELD | Admitting: Gastroenterology

## 2016-02-17 VITALS — BP 98/66 | HR 70 | Temp 97.8°F | Resp 14 | Ht 68.0 in | Wt 168.0 lb

## 2016-02-17 DIAGNOSIS — Z1211 Encounter for screening for malignant neoplasm of colon: Secondary | ICD-10-CM | POA: Diagnosis not present

## 2016-02-17 DIAGNOSIS — K5730 Diverticulosis of large intestine without perforation or abscess without bleeding: Secondary | ICD-10-CM | POA: Diagnosis not present

## 2016-02-17 DIAGNOSIS — D125 Benign neoplasm of sigmoid colon: Secondary | ICD-10-CM | POA: Diagnosis not present

## 2016-02-17 DIAGNOSIS — Z1212 Encounter for screening for malignant neoplasm of rectum: Secondary | ICD-10-CM

## 2016-02-17 MED ORDER — SODIUM CHLORIDE 0.9 % IV SOLN: 500.0000 mL | INTRAVENOUS | Status: DC

## 2016-02-17 NOTE — Patient Instructions (Signed)
YOU HAD AN ENDOSCOPIC PROCEDURE TODAY AT THE [facility]:   Refer to the procedure report that was given to you for any specific questions about what was found during the examination.  If the procedure report does not answer your questions, please call your gastroenterologist to clarify.  If you requested that your care partner not be given the details of your procedure findings, then the procedure report has been included in a sealed envelope for you to review at your convenience later.  YOU SHOULD EXPECT: Some feelings of bloating in the abdomen. Passage of more gas than usual.  Walking can help get rid of the air that was put into your GI tract during the procedure and reduce the bloating. If you had a lower endoscopy (such as a colonoscopy or flexible sigmoidoscopy) you may notice spotting of blood in your stool or on the toilet paper. If you underwent a bowel prep for your procedure, you may not have a normal bowel movement for a few days.  Please Note:  You might notice some irritation and congestion in your nose or some drainage.  This is from the oxygen used during your procedure.  There is no need for concern and it should clear up in a day or so.  SYMPTOMS TO REPORT IMMEDIATELY:   Following lower endoscopy (colonoscopy or flexible sigmoidoscopy):  Excessive amounts of blood in the stool  Significant tenderness or worsening of abdominal pains  Swelling of the abdomen that is new, acute  Fever of 100F or higher   For urgent or emergent issues, a gastroenterologist can be reached at any hour by calling [phone].   DIET:  We do recommend a small meal at first, but then you may proceed to your regular diet.  Drink plenty of fluids but you should avoid alcoholic beverages for 24 hours. Try to increase the fiber in your diet, and drink plenty of water.  ACTIVITY:  You should plan to take it easy for the rest of today and you should NOT DRIVE or use heavy machinery until  tomorrow (because of the sedation medicines used during the test).    FOLLOW UP: Our staff will call the number listed on your records the next business day following your procedure to check on you and address any questions or concerns that you may have regarding the information given to you following your procedure. If we do not reach you, we will leave a message.  However, if you are feeling well and you are not experiencing any problems, there is no need to return our call.  We will assume that you have returned to your regular daily activities without incident.  If any biopsies were taken you will be contacted by phone or by letter within the next 1-3 weeks.  Please call us at [phone] if you have not heard about the biopsies in 3 weeks.    SIGNATURES/CONFIDENTIALITY: You and/or your care partner have signed paperwork which will be entered into your electronic medical record.  These signatures attest to the fact that that the information above on your After Visit Summary has been reviewed and is understood.  Full responsibility of the confidentiality of this discharge information lies with you and/or your care-partner.  Read all of the handouts given to you by your recovery room nurse.  Thank-you for choosing us for your healthcare needs today. 

## 2016-02-17 NOTE — Progress Notes (Signed)
Report to PACU, RN, vss, BBS= Clear.  

## 2016-02-17 NOTE — Op Note (Signed)
[facility] Patient Name: Blake Nash Procedure Date: 02/17/2016 10:51 AM MRN: [id number] Endoscopist: Milus Banister , MD Age: 60 Referring MD:  Date of Birth: [birth year] Gender: Male Account #: [id number] Procedure:                Colonoscopy Indications:              Screening for colorectal malignant neoplasm Medicines:                Monitored Anesthesia Care Procedure:                Pre-Anesthesia Assessment:                           - Prior to the procedure, a History and Physical                            was performed, and patient medications and                            allergies were reviewed. The patient's tolerance of                            previous anesthesia was also reviewed. The risks                            and benefits of the procedure and the sedation                            options and risks were discussed with the patient.                            All questions were answered, and informed consent                            was obtained. Prior Anticoagulants: The patient has                            taken no previous anticoagulant or antiplatelet                            agents. ASA Grade Assessment: II - A patient with                            mild systemic disease. After reviewing the risks                            and benefits, the patient was deemed in                            satisfactory condition to undergo the procedure.                           After obtaining informed consent, the colonoscope  was passed under direct vision. Throughout the                            procedure, the patient's blood pressure, pulse, and                            oxygen saturations were monitored continuously. The                            Model CF-HQ190L [id number]) scope was introduced                            through the anus and advanced to the the cecum,                            identified by  appendiceal orifice and ileocecal                            valve. The colonoscopy was performed without                            difficulty. The patient tolerated the procedure                            well. The quality of the bowel preparation was                            excellent. The ileocecal valve, appendiceal                            orifice, and rectum were photographed. Scope In: 11:04:11 AM Scope Out: 11:18:59 AM Scope Withdrawal Time: 0 hours 12 minutes 8 seconds  Total Procedure Duration: 0 hours 14 minutes 48 seconds  Findings:                 A 15 mm polyp was found in the sigmoid colon (25cm                            from anal verge). The polyp was semi-pedunculated.                            The polyp was removed with a hot snare. Resection                            and retrieval were complete.                           Many small-mouthed diverticula were found in the                            left colon.                           The exam was otherwise without abnormality on  direct and retroflexion views. Complications:            No immediate complications. Estimated blood loss:                            None. Estimated Blood Loss:     Estimated blood loss: none. Impression:               - One 15 mm polyp in the sigmoid colon, removed                            with a hot snare. Resected and retrieved.                           - Diverticulosis in the left colon.                           - The examination was otherwise normal on direct                            and retroflexion views. Recommendation:           - Patient has a contact number available for                            emergencies. The signs and symptoms of potential                            delayed complications were discussed with the                            patient. Return to normal activities tomorrow.                            Written discharge  instructions were provided to the                            patient.                           - Resume previous diet.                           - Continue present medications.                           - No ASA or NSAIDs for 2 weeks.                           You will receive a letter within 2-3 weeks with the                            pathology results and my final recommendations.                           If the polyp(s) is proven to be 'pre-cancerous' on  pathology, you will need repeat colonoscopy in 3                            years. If the polyp(s) is NOT 'precancerous' on                            pathology then you should repeat colon cancer                            screening in 10 years with colonoscopy without need                            for colon cancer screening by any method prior to                            then (including stool testing). Milus Banister, MD 02/17/2016 11:22:31 AM This report has been signed electronically.

## 2016-02-17 NOTE — Progress Notes (Signed)
Called to room to assist during endoscopic procedure.  Patient ID and intended procedure confirmed with present staff. Received instructions for my participation in the procedure from the performing physician.  

## 2016-02-20 ENCOUNTER — Telehealth: Payer: Self-pay

## 2016-02-20 NOTE — Telephone Encounter (Signed)
  Follow up Call-  Call back number 02/17/2016  Post procedure Call Back phone  # [phone]  Permission to leave phone message Yes  Some recent data might be hidden     Patient questions:  Do you have a fever, pain , or abdominal swelling? No. Pain Score  0 *  Have you tolerated food without any problems? Yes.    Have you been able to return to your normal activities? Yes.    Do you have any questions about your discharge instructions: Diet   No. Medications  No. Follow up visit  No.  Do you have questions or concerns about your Care? No.  Actions: * If pain score is 4 or above: No action needed, pain <4.

## 2016-03-21 ENCOUNTER — Encounter: Payer: Self-pay | Admitting: Gastroenterology

## 2016-05-17 ENCOUNTER — Ambulatory Visit (AMBULATORY_SURGERY_CENTER): Payer: Self-pay | Admitting: *Deleted

## 2016-05-17 VITALS — Ht 68.0 in | Wt 180.0 lb

## 2016-05-17 DIAGNOSIS — Z86010 Personal history of colonic polyps: Secondary | ICD-10-CM

## 2016-05-17 MED ORDER — NA SULFATE-K SULFATE-MG SULF 17.5-3.13-1.6 GM/177ML PO SOLN: ORAL | 0 refills | Status: DC

## 2016-05-17 NOTE — Progress Notes (Signed)
Patient denies any allergies to eggs or soy. Patient denies any problems with anesthesia/sedation. Patient denies any oxygen use at home and does not take any diet/weight loss medications.  

## 2016-05-23 ENCOUNTER — Encounter: Payer: Self-pay | Admitting: Gastroenterology

## 2016-06-01 ENCOUNTER — Encounter: Payer: Self-pay | Admitting: Gastroenterology

## 2016-06-01 ENCOUNTER — Ambulatory Visit (AMBULATORY_SURGERY_CENTER): Payer: BLUE CROSS/BLUE SHIELD | Admitting: Gastroenterology

## 2016-06-01 VITALS — BP 112/61 | HR 51 | Temp 96.6°F | Resp 10 | Ht 68.5 in | Wt 168.0 lb

## 2016-06-01 DIAGNOSIS — K5730 Diverticulosis of large intestine without perforation or abscess without bleeding: Secondary | ICD-10-CM

## 2016-06-01 DIAGNOSIS — Z86010 Personal history of colonic polyps: Secondary | ICD-10-CM

## 2016-06-01 MED ORDER — SODIUM CHLORIDE 0.9 % IV SOLN
500.0000 mL | INTRAVENOUS | Status: DC
Start: 2016-06-01 — End: 2023-03-21

## 2016-06-01 NOTE — Patient Instructions (Signed)
YOU HAD AN ENDOSCOPIC PROCEDURE TODAY AT [facility]:   Refer to the procedure report that was given to you for any specific questions about what was found during the examination.  If the procedure report does not answer your questions, please call your gastroenterologist to clarify.  If you requested that your care partner not be given the details of your procedure findings, then the procedure report has been included in a sealed envelope for you to review at your convenience later.  YOU SHOULD EXPECT: Some feelings of bloating in the abdomen. Passage of more gas than usual.  Walking can help get rid of the air that was put into your GI tract during the procedure and reduce the bloating. If you had a lower endoscopy (such as a colonoscopy or flexible sigmoidoscopy) you may notice spotting of blood in your stool or on the toilet paper. If you underwent a bowel prep for your procedure, you may not have a normal bowel movement for a few days.  Please Note:  You might notice some irritation and congestion in your nose or some drainage.  This is from the oxygen used during your procedure.  There is no need for concern and it should clear up in a day or so.  SYMPTOMS TO REPORT IMMEDIATELY:   Following lower endoscopy (colonoscopy or flexible sigmoidoscopy):  Excessive amounts of blood in the stool  Significant tenderness or worsening of abdominal pains  Swelling of the abdomen that is new, acute  Fever of 100F or higher  For urgent or emergent issues, a gastroenterologist can be reached at any hour by calling [phone].   DIET:  We do recommend a small meal at first, but then you may proceed to your regular diet.  Drink plenty of fluids but you should avoid alcoholic beverages for 24 hours.  ACTIVITY:  You should plan to take it easy for the rest of today and you should NOT DRIVE or use heavy machinery until tomorrow (because of the sedation medicines used during the test).     FOLLOW UP: Our staff will call the number listed on your records the next business day following your procedure to check on you and address any questions or concerns that you may have regarding the information given to you following your procedure. If we do not reach you, we will leave a message.  However, if you are feeling well and you are not experiencing any problems, there is no need to return our call.  We will assume that you have returned to your regular daily activities without incident.  SIGNATURES/CONFIDENTIALITY: You and/or your care partner have signed paperwork which will be entered into your electronic medical record.  These signatures attest to the fact that that the information above on your After Visit Summary has been reviewed and is understood.  Full responsibility of the confidentiality of this discharge information lies with you and/or your care-partner.  Please read over handouts about diverticulosis and high fiber diets  Next colonoscopy- 3 years  Continue your normal medications

## 2016-06-01 NOTE — Progress Notes (Signed)
Report to PACU, RN, vss, BBS= Clear.  

## 2016-06-01 NOTE — Op Note (Signed)
[facility] Patient Name: Blake Nash Procedure Date: 06/01/2016 10:23 AM MRN: [id number] Endoscopist: Milus Banister , MD Age: 61 Referring MD:  Date of Birth: 1955/09/25 Gender: Male Account #: [id number] Procedure:                Colonoscopy Indications:              High risk colon cancer surveillance: Personal                            history of colonic polyps; pedunculated TVA "with                            at least HGD" removed 3 months ago, margin was                            clear of adenomatous tissue. Medicines:                Monitored Anesthesia Care Procedure:                Pre-Anesthesia Assessment:                           - Prior to the procedure, a History and Physical                            was performed, and patient medications and                            allergies were reviewed. The patient's tolerance of                            previous anesthesia was also reviewed. The risks                            and benefits of the procedure and the sedation                            options and risks were discussed with the patient.                            All questions were answered, and informed consent                            was obtained. Prior Anticoagulants: The patient has                            taken no previous anticoagulant or antiplatelet                            agents. ASA Grade Assessment: II - A patient with                            mild systemic disease. After reviewing the risks  and benefits, the patient was deemed in                            satisfactory condition to undergo the procedure.                           After obtaining informed consent, the colonoscope                            was passed under direct vision. Throughout the                            procedure, the patient's blood pressure, pulse, and                            oxygen saturations were monitored  continuously. The                            CF-HQ190 was introduced through the anus and                            advanced to the the cecum, identified by                            appendiceal orifice and ileocecal valve. The                            colonoscopy was performed without difficulty. The                            patient tolerated the procedure well. The quality                            of the bowel preparation was excellent. The                            ileocecal valve, appendiceal orifice, and rectum                            were photographed. Scope In: 10:33:04 AM Scope Out: [id number] AM Scope Withdrawal Time: 0 hours 10 minutes 14 seconds  Total Procedure Duration: 0 hours 16 minutes 14 seconds  Findings:                 Multiple small and large-mouthed diverticula were                            found in the left colon.                           The exam was otherwise without abnormality on                            direct and retroflexion views. Complications:            No immediate complications.  Estimated blood loss:                            None. Estimated Blood Loss:     Estimated blood loss: none. Impression:               - Diverticulosis in the left colon.                           - The examination was otherwise normal on direct                            and retroflexion views.                           - No polyps or cancers Recommendation:           - Patient has a contact number available for                            emergencies. The signs and symptoms of potential                            delayed complications were discussed with the                            patient. Return to normal activities tomorrow.                            Written discharge instructions were provided to the                            patient.                           - Resume previous diet.                           - Continue present medications.                            - Repeat colonoscopy in 3 years for surveillance. Milus Banister, MD 06/01/2016 10:52:10 AM This report has been signed electronically.

## 2016-06-04 ENCOUNTER — Telehealth: Payer: Self-pay

## 2016-06-04 NOTE — Telephone Encounter (Signed)
  Follow up Call-  Call back number 06/01/2016 02/17/2016  Post procedure Call Back phone  # [phone]  Permission to leave phone message Yes Yes  Some recent data might be hidden     Patient questions:  Do you have a fever, pain , or abdominal swelling? No. Pain Score  0 *  Have you tolerated food without any problems? Yes.    Have you been able to return to your normal activities? Yes.    Do you have any questions about your discharge instructions: Diet   No. Medications  No. Follow up visit  No.  Do you have questions or concerns about your Care? No.  Actions: * If pain score is 4 or above: No action needed, pain <4.  No problems per pt. maw

## 2016-07-31 ENCOUNTER — Ambulatory Visit (INDEPENDENT_AMBULATORY_CARE_PROVIDER_SITE_OTHER): Payer: Self-pay

## 2016-07-31 ENCOUNTER — Ambulatory Visit (INDEPENDENT_AMBULATORY_CARE_PROVIDER_SITE_OTHER): Payer: BLUE CROSS/BLUE SHIELD | Admitting: Orthopaedic Surgery

## 2016-07-31 ENCOUNTER — Encounter (INDEPENDENT_AMBULATORY_CARE_PROVIDER_SITE_OTHER): Payer: Self-pay | Admitting: Orthopaedic Surgery

## 2016-07-31 VITALS — BP 109/74 | HR 64 | Ht 68.0 in | Wt 175.0 lb

## 2016-07-31 DIAGNOSIS — M25562 Pain in left knee: Secondary | ICD-10-CM | POA: Diagnosis not present

## 2016-07-31 DIAGNOSIS — M2242 Chondromalacia patellae, left knee: Secondary | ICD-10-CM | POA: Diagnosis not present

## 2016-07-31 NOTE — Progress Notes (Signed)
Office Visit Note   Patient: Blake Nash           Date of Birth: [birth year]           MRN: [id number] Visit Date: 07/31/2016              Requested by: Darlyne Russian, MD [address] [place], [place] [address] PCP: Jenny Reichmann, MD   Assessment & Plan: Visit Diagnoses:  1. Acute pain of left knee   2. Chondromalacia patellae, left knee   3.      Post left knee tibial plateau ORIF with 2 cannulated screws, healed without significant femoral tibial arthritis  Plan: Patient's had some mild symptoms with his knee particularly with occasional catching which seems to be from the patellofemoral joint. He's better than he was 3 weeks ago when he recently made the appointment. He gets increased symptoms he could return for cortisone injection. No significant effusion is present today and he has no chondrocalcinosis. No history of gout. He gets increased symptoms he will return. X-ray results were reviewed with the patient and his wife.  Follow-Up Instructions: Return if symptoms worsen or fail to improve.   Orders:  Orders Placed This Encounter  Procedures  . XR Knee 1-2 Views Left   No orders of the defined types were placed in this encounter.     Procedures: No procedures performed   Clinical Data: No additional findings.   Subjective: Chief Complaint  Patient presents with  . Left Knee - Pain    Patient presents with left knee pain. He states that about 18 years ago, Dr. Lorin Mercy put screws in his proximal tibia due to forklift accident. He started to have some pain recently just below the patella medially and laterally. He also had some calf pain. He states that his knee felt weak like it was not going to hold him up. He has been wearing a copper fit brace on the knee occasionally since then with some relief. He would just like to be sure that nothing else is going on there.     Review of Systems  Constitutional: Negative for chills and diaphoresis.  HENT: Negative  for ear discharge, ear pain and nosebleeds.   Eyes: Negative for discharge and visual disturbance.  Respiratory: Negative for cough, choking and shortness of breath.   Cardiovascular: Negative for chest pain and palpitations.  Gastrointestinal: Negative for abdominal distention and abdominal pain.  Endocrine: Negative for cold intolerance and heat intolerance.  Genitourinary: Negative for flank pain and hematuria.  Musculoskeletal:       Previous ORIF lateral tibial plateau with 2 screws 18 years ago. He is occasionally used initially and occasional anti-inflammatory.  Skin: Negative for rash and wound.  Neurological: Negative for seizures and speech difficulty.  Hematological: Negative for adenopathy. Does not bruise/bleed easily.  Psychiatric/Behavioral: Negative for agitation and suicidal ideas.     Objective: Vital Signs: BP 109/74   Pulse 64   Ht 5\' 8"  (1.727 m)   Wt 175 lb (79.4 kg)   BMI 26.61 kg/m   Physical Exam  Constitutional: He is oriented to person, place, and time. He appears well-developed and well-nourished.  HENT:  Head: Normocephalic and atraumatic.  Eyes: EOM are normal. Pupils are equal, round, and reactive to light.  Neck: No tracheal deviation present. No thyromegaly present.  Cardiovascular: Normal rate.   Pulmonary/Chest: Effort normal. He has no wheezes.  Abdominal: Soft. Bowel sounds are normal.  Musculoskeletal:  Patient  has full range of motion right and left knee full extension there is crepitus with extension both knees and pain with patellar loading. No significant joint effusion collateral cruciate ligament exam is normal. Mild joint line tenderness adjacent to the patella of the left knee. No tenderness over the lateral screws. Pulses are normal negative straight leg raising trochanters are normal normal hip range of motion reflexes are 2+ anterior tib EHL is intact no pitting edema to soft no compartment tenderness. Ankle dorsiflexion plantar  flexion is normal.  Neurological: He is alert and oriented to person, place, and time.  Skin: Skin is warm and dry. Capillary refill takes less than 2 seconds.  Psychiatric: He has a normal mood and affect. His behavior is normal. Judgment and thought content normal.    Ortho Exam  Specialty Comments:  No specialty comments available.  Imaging: Xr Knee 1-2 Views Left  Result Date: 07/31/2016 Standing AP both knees lateral left knee x-rays are reviewed. This shows previous fixation tibial plateau fracture with 26.5 cancellous lag screws. Fracture is healed. He has tiny medial osteophytes on the femur. No significant joint space narrowing. Impression: Post ORIF lateral tibial plateau fracture with mild spurring. Joint space is maintained.    PMFS History: Patient Active Problem List   Diagnosis Date Noted  . Asthma 01/29/2012  . Allergic rhinitis 01/29/2012   Past Medical History:  Diagnosis Date  . Allergy   . Asthma   . Depression   . History of blood transfusion    1990's    Family History  Problem Relation Age of Onset  . Mental illness Mother     schizophrenia  . Heart disease Father     AMI  . Colon cancer Neg Hx     Past Surgical History:  Procedure Laterality Date  . FRACTURE SURGERY    . HERNIA REPAIR    . PILONIDAL CYST EXCISION  1980  . VASECTOMY     Social History   Occupational History  . Not on file.   Social History Main Topics  . Smoking status: Former Smoker    Types: Cigarettes  . Smokeless tobacco: Never Used     Comment: quit at age 6  . Alcohol use Yes     Comment: occ  . Drug use: No  . Sexual activity: Not on file

## 2016-09-12 ENCOUNTER — Other Ambulatory Visit: Payer: Self-pay | Admitting: Physician Assistant

## 2016-12-11 DIAGNOSIS — H02831 Dermatochalasis of right upper eyelid: Secondary | ICD-10-CM | POA: Diagnosis not present

## 2016-12-11 DIAGNOSIS — H02834 Dermatochalasis of left upper eyelid: Secondary | ICD-10-CM | POA: Diagnosis not present

## 2016-12-11 DIAGNOSIS — H0289 Other specified disorders of eyelid: Secondary | ICD-10-CM | POA: Diagnosis not present

## 2016-12-11 DIAGNOSIS — H2513 Age-related nuclear cataract, bilateral: Secondary | ICD-10-CM | POA: Diagnosis not present

## 2017-04-23 ENCOUNTER — Encounter: Payer: Self-pay | Admitting: Family Medicine

## 2017-04-23 ENCOUNTER — Ambulatory Visit: Payer: BLUE CROSS/BLUE SHIELD | Admitting: Family Medicine

## 2017-04-23 ENCOUNTER — Other Ambulatory Visit: Payer: Self-pay

## 2017-04-23 VITALS — BP 118/68 | HR 75 | Temp 98.1°F | Resp 16 | Ht 68.0 in | Wt 181.4 lb

## 2017-04-23 DIAGNOSIS — J4530 Mild persistent asthma, uncomplicated: Secondary | ICD-10-CM

## 2017-04-23 DIAGNOSIS — Z23 Encounter for immunization: Secondary | ICD-10-CM | POA: Diagnosis not present

## 2017-04-23 MED ORDER — FLUTICASONE PROPIONATE HFA 110 MCG/ACT IN AERO: 2.0000 | INHALATION_SPRAY | Freq: Two times a day (BID) | RESPIRATORY_TRACT | 3 refills | Status: DC

## 2017-04-23 MED ORDER — ALBUTEROL SULFATE HFA 108 (90 BASE) MCG/ACT IN AERS: INHALATION_SPRAY | RESPIRATORY_TRACT | 1 refills | Status: DC

## 2017-04-23 NOTE — Progress Notes (Signed)
Subjective:  By signing my name below, I, Blake Nash, attest that this documentation has been prepared under the direction and in the presence of Blake Ray, MD. Electronically Signed: Moises Nash, [place]. 04/23/2017 , 5:27 PM .  Patient was seen in Room 1 .   Patient ID: Blake Nash, male    DOB: [birth year], 61 y.o.   MRN: [id number] Chief Complaint  Patient presents with  . Medication Refill    patient requesting refills for inhalers, would also like flu and Tdap vaccines   HPI Blake Nash is a 61 y.o. male Here for follow up of asthma. Patient was last seen in Aug 2017. His daughter gave birth to his 5th grandchild a few days ago.   Asthma He was using Flovent 2 puffs BID with albuterol as needed. Overall, appeared stable so stayed on same doses. He's also had allergic rhinitis, Flonase and Zyrtec as needed.   He states during the summer, he usually does well without his maintenance inhaler, Flovent. He's able to not use it or even tapered back, though still depending on the weather. Usually, he's on Flovent 2 puffs BID and while on it, he doesn't need Albuterol. He denies any night time shortness of breath. He denies needing albuterol more than twice a week typically.   Immunizations Immunization History  Administered Date(s) Administered  . Influenza Split 01/29/2012   Flu shot: he received flu shot today.  Tdap: he updated Tdap today.   Patient Active Problem List   Diagnosis Date Noted  . Asthma 01/29/2012  . Allergic rhinitis 01/29/2012   Past Medical History:  Diagnosis Date  . Allergy   . Asthma   . Depression   . History of Nash transfusion    1990's   Past Surgical History:  Procedure Laterality Date  . FRACTURE SURGERY    . HERNIA REPAIR    . PILONIDAL CYST EXCISION  1980  . VASECTOMY     Allergies  Allergen Reactions  . Gentamycin [Gentamicin] Other (See Comments)    Eye drop   Prior to Admission medications   Medication Sig  Start Date End Date Taking? Authorizing Provider  cetirizine (ZYRTEC) 10 MG tablet Take 10 mg by mouth as needed for allergies.    [provider]  cromolyn (OPTICROM) 4 % ophthalmic solution Place 1 drop into both eyes 4 (four) times daily.    [provider]  fluticasone (FLOVENT HFA) 110 MCG/ACT inhaler Inhale 2 puffs into the lungs 2 (two) times daily. 12/15/15   Wendie Agreste, MD  mometasone (NASONEX) 50 MCG/ACT nasal spray Place 2 sprays into the nose as needed.    [provider]  VENTOLIN HFA 108 (90 Base) MCG/ACT inhaler INHALE 1 TO 2 PUFFS INTO THE LUNGS EVERY 6 HOURS AS NEEDED 09/17/16   Gale Journey, Damaris Hippo, PA-C   Social History   Socioeconomic History  . Marital status: Married    Spouse name: Not on file  . Number of children: Not on file  . Years of education: Not on file  . Highest education level: Not on file  Social Needs  . Financial resource strain: Not on file  . Food insecurity - worry: Not on file  . Food insecurity - inability: Not on file  . Transportation needs - medical: Not on file  . Transportation needs - non-medical: Not on file  Occupational History  . Not on file  Tobacco Use  . Smoking status: Former Smoker  Types: Cigarettes  . Smokeless tobacco: Never Used  . Tobacco comment: quit at age 52  Substance and Sexual Activity  . Alcohol use: Yes    Comment: occ  . Drug use: No  . Sexual activity: Not on file  Other Topics Concern  . Not on file  Social History Narrative   Marital status: married      Children: 2 children; +grandchildren 4      Lives:  With wife.      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   Review of Systems  Constitutional: Negative for fatigue and unexpected weight change.  Eyes: Negative for visual disturbance.  Respiratory: Negative  for cough, chest tightness and shortness of breath.   Cardiovascular: Negative for chest pain, palpitations and leg swelling.  Gastrointestinal: Negative for abdominal pain and Nash in stool.  Neurological: Negative for dizziness, light-headedness and headaches.       Objective:   Physical Exam  Constitutional: He is oriented to person, place, and time. He appears well-developed and well-nourished.  HENT:  Head: Normocephalic and atraumatic.  Right Ear: Tympanic membrane, external ear and ear canal normal.  Left Ear: Tympanic membrane, external ear and ear canal normal.  Nose: No rhinorrhea.  Mouth/Throat: Oropharynx is clear and moist and mucous membranes are normal. No oropharyngeal exudate or posterior oropharyngeal erythema.  Eyes: Conjunctivae are normal. Pupils are equal, round, and reactive to light.  Neck: Neck supple.  Cardiovascular: Normal rate, regular rhythm, normal heart sounds and intact distal pulses.  No murmur heard. Pulmonary/Chest: Effort normal and breath sounds normal. He has no wheezes. He has no rhonchi. He has no rales.  Abdominal: Soft. There is no tenderness.  Lymphadenopathy:    He has no cervical adenopathy.  Neurological: He is alert and oriented to person, place, and time.  Skin: Skin is warm and dry. No rash noted.  Psychiatric: He has a normal mood and affect. His behavior is normal.  Vitals reviewed.   Vitals:   04/23/17 1646  BP: 118/68  Pulse: 75  Resp: 16  Temp: 98.1 F (36.7 C)  TempSrc: Oral  SpO2: 96%  Weight: 181 lb 6.4 oz (82.3 kg)  Height: 5\' 8"  (1.727 m)      Assessment & Plan:   [name] is a 61 y.o. male Mild persistent asthma without complication - Plan: albuterol (VENTOLIN HFA) 108 (90 Base) MCG/ACT inhaler, fluticasone (FLOVENT HFA) 110 MCG/ACT inhaler  - overall stable. Continue Flovent same dose. Albuterol if needed for breakthrough symptoms.  Need for prophylactic vaccination and inoculation against influenza  - Plan: Flu Vaccine QUAD 36+ mos IM  Need for Tdap vaccination - Plan: Tdap vaccine greater than or equal to 7yo IM  Recheck 3-6 months for physical.  Meds ordered this encounter  Medications  . albuterol (VENTOLIN HFA) 108 (90 Base) MCG/ACT inhaler    Sig: INHALE 1 TO 2 PUFFS INTO THE LUNGS EVERY 6 HOURS AS NEEDED    Dispense:  18 g    Refill:  1  . fluticasone (FLOVENT HFA) 110 MCG/ACT inhaler    Sig: Inhale 2 puffs into the lungs 2 (two) times daily.    Dispense:  3 Inhaler    Refill:  3   Patient Instructions  Thanks for coming in today. Continue Flovent 2 puffs twice per day for maintenance, albuterol if needed for breakthrough wheezing.  Tdap vaccine as well as flu vaccine were given today. Follow-up in the next 3-6 months for physical and we can look at other screening and health maintenance items.    IF you received an x-Nash today, you will receive an invoice from [facility]. Please contact [facility] at [phone] with questions or concerns regarding your invoice.   IF you received labwork today, you will receive an invoice from [facility]. Please contact [facility] at [phone] with questions or concerns regarding your invoice.   Our billing staff will not be able to assist you with questions regarding bills from these companies.  You will be contacted with the lab results as soon as they are available. The fastest way to get your results is to activate your My Chart account. Instructions are located on the last page of this paperwork. If you have not heard from [place] regarding the results in 2 weeks, please contact this office.       I personally performed the services described in this documentation, which was scribed in my presence. The recorded information has been reviewed and considered for accuracy and completeness, addended by me as needed, and agree with information above.  Signed,   Blake Ray, MD [facility].  04/27/17 8:55 AM

## 2017-04-23 NOTE — Patient Instructions (Signed)
   Thanks for coming in today. Continue Flovent 2 puffs twice per day for maintenance, albuterol if needed for breakthrough wheezing.  Tdap vaccine as well as flu vaccine were given today. Follow-up in the next 3-6 months for physical and we can look at other screening and health maintenance items.    IF you received an x-ray today, you will receive an invoice from [facility]. Please contact [facility] at [phone] with questions or concerns regarding your invoice.   IF you received labwork today, you will receive an invoice from [facility]. Please contact [facility] at [phone] with questions or concerns regarding your invoice.   Our billing staff will not be able to assist you with questions regarding bills from these companies.  You will be contacted with the lab results as soon as they are available. The fastest way to get your results is to activate your My Chart account. Instructions are located on the last page of this paperwork. If you have not heard from [place] regarding the results in 2 weeks, please contact this office.

## 2017-04-27 ENCOUNTER — Encounter: Payer: Self-pay | Admitting: Family Medicine

## 2017-07-10 ENCOUNTER — Telehealth: Payer: Self-pay | Admitting: Family Medicine

## 2017-07-10 NOTE — Telephone Encounter (Signed)
Called and spoke to pt to remind them of their apt tomorrow. Advised of building number and time restrictions.

## 2017-07-11 ENCOUNTER — Ambulatory Visit (INDEPENDENT_AMBULATORY_CARE_PROVIDER_SITE_OTHER): Payer: BLUE CROSS/BLUE SHIELD | Admitting: Family Medicine

## 2017-07-11 ENCOUNTER — Encounter: Payer: Self-pay | Admitting: Family Medicine

## 2017-07-11 ENCOUNTER — Other Ambulatory Visit: Payer: Self-pay

## 2017-07-11 VITALS — BP 98/68 | HR 93 | Temp 98.4°F | Resp 18 | Ht 67.0 in | Wt 174.0 lb

## 2017-07-11 DIAGNOSIS — Z136 Encounter for screening for cardiovascular disorders: Secondary | ICD-10-CM

## 2017-07-11 DIAGNOSIS — Z0000 Encounter for general adult medical examination without abnormal findings: Secondary | ICD-10-CM | POA: Diagnosis not present

## 2017-07-11 DIAGNOSIS — J45901 Unspecified asthma with (acute) exacerbation: Secondary | ICD-10-CM | POA: Diagnosis not present

## 2017-07-11 DIAGNOSIS — Z125 Encounter for screening for malignant neoplasm of prostate: Secondary | ICD-10-CM

## 2017-07-11 DIAGNOSIS — Z8249 Family history of ischemic heart disease and other diseases of the circulatory system: Secondary | ICD-10-CM

## 2017-07-11 DIAGNOSIS — E785 Hyperlipidemia, unspecified: Secondary | ICD-10-CM

## 2017-07-11 NOTE — Progress Notes (Signed)
Subjective:  This chart was scribed for Blake Agreste, MD by [name], at [place] at [facility].  This patient was seen in room 8 and the patient's care was started at 10:57 AM.   Chief Complaint  Patient presents with  . Annual Exam     Patient ID: Blake Nash, male    DOB: 12-24-1955, 62 y.o.   MRN: [id number]  HPI HPI Comments: Blake Nash is a 62 y.o. male who presents to Primary Care at [facility] for a complete physical.  He has a history of asthma, last seen in December.     Asthma: He had been on Flovent 2 puffs BID with albuterol if needed. Sometimes able to do without maintenance inhaler during the summer.  He has taken nasonex and Zyrtec for allergies. ---- Patient thinks that his asthma may be related to food (especially when he eats too much sugar).  He has been staying away from eating processed foods and finds that this makes a big difference for him.  He has been using his asthma medication as needed.     Cancer screening:  Colon cancer screening: Colonoscopy January 2018, Dr. Ardis Hughs. He has a history of polyps, diverticulosis noted, no polyps removed, repeat in three years. Prostate cancer screening: Patient would like a prostate test today.  Lab Results  Component Value Date   PSA 0.8 12/15/2015    Immunizations:  Immunization History  Administered Date(s) Administered  . Influenza Split 01/29/2012  . Influenza,inj,Quad PF,6+ Mos 04/23/2017  . Tdap 04/23/2017  Shingles: Patient has had shingles in the past due to a work related trauma.  He would not like a shingles vaccine today.    Depression:  Depression screen [facility] 2/9 07/11/2017 04/23/2017 12/15/2015 12/17/2014  Decreased Interest 0 0 0 0  Down, Depressed, Hopeless 0 0 0 0  PHQ - 2 Score 0 0 0 0    Vision: He recently saw [name] who did not see any concerns.   Visual Acuity Screening   Right eye Left eye Both eyes  Without correction:     With correction: 20/15-1 20/15-1 20/15-1      Dentist: Patient has a dentist who he sees regularly.    Exercise: Patient walks about a mile (or more) daily. He gets shortness of breath occasionally due to the cold weather or asthma but denies any shortness of breath on exertion.    Screening: He had HEP C and HIV screening in 2017, lipid screening in 2017.    Hyperlipidemia: I did recommend repeating those liver tests in six months to a year. --- Patient has a family history of heart problems (dad- heart disease with bypass in his 65's, was also a smoker)  and states that they all had "poor diets".  Lab Results  Component Value Date   CHOL 282 (H) 12/15/2015   HDL 76 12/15/2015   LDLCALC 193 (H) 12/15/2015   TRIG 66 12/15/2015   CHOLHDL 3.7 12/15/2015    Lab Results  Component Value Date   ALT 19 12/15/2015   AST 24 12/15/2015   ALKPHOS 63 12/15/2015   BILITOT 0.6 12/15/2015    Patient Active Problem List   Diagnosis Date Noted  . Asthma 01/29/2012  . Allergic rhinitis 01/29/2012   Past Medical History:  Diagnosis Date  . Allergy   . Asthma   . Depression   . History of blood transfusion    1990's   Past Surgical History:  Procedure Laterality  Date  . FRACTURE SURGERY    . HERNIA REPAIR    . PILONIDAL CYST EXCISION  1980  . VASECTOMY     Allergies  Allergen Reactions  . Gentamycin [Gentamicin] Other (See Comments)    Eye drop   Prior to Admission medications   Medication Sig Start Date End Date Taking? Authorizing Provider  albuterol (VENTOLIN HFA) 108 (90 Base) MCG/ACT inhaler INHALE 1 TO 2 PUFFS INTO THE LUNGS EVERY 6 HOURS AS NEEDED 04/23/17  Yes Blake Agreste, MD  cetirizine (ZYRTEC) 10 MG tablet Take 10 mg by mouth as needed for allergies.   Yes [provider]  cromolyn (OPTICROM) 4 % ophthalmic solution Place 1 drop into both eyes 4 (four) times daily.   Yes [provider]  fluticasone (FLOVENT HFA) 110 MCG/ACT inhaler Inhale 2 puffs into the lungs 2 (two) times daily.  04/23/17  Yes Blake Agreste, MD  mometasone (NASONEX) 50 MCG/ACT nasal spray Place 2 sprays into the nose as needed.   Yes [provider]   Social History   Socioeconomic History  . Marital status: Married    Spouse name: Not on file  . Number of children: Not on file  . Years of education: Not on file  . Highest education level: Not on file  Social Needs  . Financial resource strain: Not on file  . Food insecurity - worry: Not on file  . Food insecurity - inability: Not on file  . Transportation needs - medical: Not on file  . Transportation needs - non-medical: Not on file  Occupational History  . Not on file  Tobacco Use  . Smoking status: Former Smoker    Types: Cigarettes  . Smokeless tobacco: Never Used  . Tobacco comment: quit at age 15  Substance and Sexual Activity  . Alcohol use: Yes    Comment: occ  . Drug use: No  . Sexual activity: Not on file  Other Topics Concern  . Not on file  Social History Narrative   Marital status: married      Children: 2 children; +grandchildren 4      Lives:  With wife.      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    Review of Systems  Allergic/Immunologic: Positive for environmental allergies.  All other systems reviewed and are negative.      Objective:   Physical Exam  Constitutional: He is oriented to person, place, and time. He appears well-developed and well-nourished.  HENT:  Head: Normocephalic and atraumatic.  Right Ear: External ear normal.  Left Ear: External ear normal.  Mouth/Throat: Oropharynx is clear and moist.  Eyes: Conjunctivae and EOM are normal. Pupils are equal, round, and reactive to light.  Neck: Normal range of motion. Neck supple. No thyromegaly present.  Cardiovascular: Normal rate, regular rhythm, normal heart sounds and  intact distal pulses.  Pulmonary/Chest: Effort normal and breath sounds normal. No respiratory distress. He has no wheezes.  Abdominal: Soft. He exhibits no distension. There is no tenderness. Hernia confirmed negative in the right inguinal area and confirmed negative in the left inguinal area.  Genitourinary: Prostate normal.  Musculoskeletal: Normal range of motion. He exhibits no edema or tenderness.  Lymphadenopathy:    He has no cervical  adenopathy.  Neurological: He is alert and oriented to person, place, and time. He has normal reflexes.  Skin: Skin is warm and dry.  Psychiatric: He has a normal mood and affect. His behavior is normal.  Vitals reviewed.   Vitals:   07/11/17 1026  BP: 98/68  Pulse: 93  Resp: 18  Temp: 98.4 F (36.9 C)  TempSrc: Oral  SpO2: 96%  Weight: 174 lb (78.9 kg)  Height: 5\' 7"  (1.702 m)   FBP:ZWCHE rhythm, no acute findings.      Assessment & Plan:  TOME WILSON is a 62 y.o. male Annual physical exam  - -anticipatory guidance as below in AVS, screening labs above. Health maintenance items as above in HPI discussed/recommended as applicable.   Hyperlipidemia, unspecified hyperlipidemia type - Plan: Lipid panel, Comprehensive metabolic panel  - Check labs. 10 year ASCVD risk can be cannulated after those are done. Would consider statin if borderline given family history of cardiac disease.  Asthma with acute exacerbation, unspecified asthma severity, unspecified whether persistent  -Stable, continue Flovent as needed for maintenance albuterol for breakthrough symptoms. RTC precautions if worsening or increased albuterol requirement  Screening for prostate cancer - Plan: PSA  -We discussed pros and cons of prostate cancer screening, and after this discussion, he chose to have screening done. PSA obtained, and no concerning findings on DRE.   Screening for cardiovascular condition, family history of cardiac disease- Plan: EKG 12-Lead  - No  concerning findings on EKG. family history of coronary disease, check lipids as above.   No orders of the defined types were placed in this encounter.  Patient Instructions    I will check cholesterol again today. If LDL is over 190, may need to consider statin, but can also look at 10 year risk. With family history of heart disease, may recommend a statin.   No change in asthma meds for now.   Let me know if you want the shingles vaccine sent to your pharmacy.   Thanks for coming in today.   Keeping you healthy  Get these tests  Blood pressure- Have your blood pressure checked once a year by your healthcare provider.  Normal blood pressure is 120/80  Weight- Have your body mass index (BMI) calculated to screen for obesity.  BMI is a measure of body fat based on height and weight. You can also calculate your own BMI at [employer].  Cholesterol- Have your cholesterol checked every year.  Diabetes- Have your blood sugar checked regularly if you have high blood pressure, high cholesterol, have a family history of diabetes or if you are overweight.  Screening for Colon Cancer- Colonoscopy starting at age 31.  Screening may begin sooner depending on your family history and other health conditions. Follow up colonoscopy as directed by your Gastroenterologist.  Screening for Prostate Cancer- Both blood work (PSA) and a rectal exam help screen for Prostate Cancer.  Screening begins at age 63 with African-American men and at age 62 with Caucasian men.  Screening may begin sooner depending on your family history.  Take these medicines  Aspirin- One aspirin daily can help prevent Heart disease and Stroke.  Flu shot- Every fall.  Tetanus- Every 10 years.  Shingrix- After the age of 75 to prevent Shingles.  Pneumonia shot- Once after the age of 42; if you are younger than 44, ask your healthcare provider if you need a Pneumonia shot.  Take these steps  Don't smoke- If  you do smoke, talk to  your doctor about quitting.  For tips on how to quit, go to www.smokefree.gov or call 1-800-QUIT-NOW.  Be physically active- Exercise 5 days a week for at least 30 minutes.  If you are not already physically active start slow and gradually work up to 30 minutes of moderate physical activity.  Examples of moderate activity include walking briskly, mowing the yard, dancing, swimming, bicycling, etc.  Eat a healthy diet- Eat a variety of healthy food such as fruits, vegetables, low fat milk, low fat cheese, yogurt, lean meant, poultry, fish, beans, tofu, etc. For more information go to www.thenutritionsource.org  Drink alcohol in moderation- Limit alcohol intake to less than two drinks a day. Never drink and drive.  Dentist- Brush and floss twice daily; visit your dentist twice a year.  Depression- Your emotional health is as important as your physical health. If you're feeling down, or losing interest in things you would normally enjoy please talk to your healthcare provider.  Eye exam- Visit your eye doctor every year.  Safe sex- If you may be exposed to a sexually transmitted infection, use a condom.  Seat belts- Seat belts can save your life; always wear one.  Smoke/Carbon Monoxide detectors- These detectors need to be installed on the appropriate level of your home.  Replace batteries at least once a year.  Skin cancer- When out in the sun, cover up and use sunscreen 15 SPF or higher.  Violence- If anyone is threatening you, please tell your healthcare provider.  Living Will/ Health care power of attorney- Speak with your healthcare provider and family.  IF you received an x-ray today, you will receive an invoice from [facility]. Please contact [facility] at [phone] with questions or concerns regarding your invoice.   IF you received labwork today, you will receive an invoice from [place]. Please contact [facility] at [phone] with  questions or concerns regarding your invoice.   Our billing staff will not be able to assist you with questions regarding bills from these companies.  You will be contacted with the lab results as soon as they are available. The fastest way to get your results is to activate your My Chart account. Instructions are located on the last page of this paperwork. If you have not heard from [place] regarding the results in 2 weeks, please contact this office.      I personally performed the services described in this documentation, which was scribed in my presence. The recorded information has been reviewed and considered for accuracy and completeness, addended by me as needed, and agree with information above.  Signed,   [name], MD [facility].  07/12/17 4:31 PM

## 2017-07-11 NOTE — Progress Notes (Signed)
° °  Subjective:  See other note

## 2017-07-11 NOTE — Patient Instructions (Addendum)
I will check cholesterol again today. If LDL is over 190, may need to consider statin, but can also look at 10 year risk. With family history of heart disease, may recommend a statin.   No change in asthma meds for now.   Let me know if you want the shingles vaccine sent to your pharmacy.   Thanks for coming in today.   Keeping you healthy  Get these tests  Blood pressure- Have your blood pressure checked once a year by your healthcare provider.  Normal blood pressure is 120/80  Weight- Have your body mass index (BMI) calculated to screen for obesity.  BMI is a measure of body fat based on height and weight. You can also calculate your own BMI at [email or web address].  Cholesterol- Have your cholesterol checked every year.  Diabetes- Have your blood sugar checked regularly if you have high blood pressure, high cholesterol, have a family history of diabetes or if you are overweight.  Screening for Colon Cancer- Colonoscopy starting at age 42.  Screening may begin sooner depending on your family history and other health conditions. Follow up colonoscopy as directed by your Gastroenterologist.  Screening for Prostate Cancer- Both blood work (PSA) and a rectal exam help screen for Prostate Cancer.  Screening begins at age 69 with African-American men and at age 4 with Caucasian men.  Screening may begin sooner depending on your family history.  Take these medicines  Aspirin- One aspirin daily can help prevent Heart disease and Stroke.  Flu shot- Every fall.  Tetanus- Every 10 years.  Shingrix- After the age of 43 to prevent Shingles.  Pneumonia shot- Once after the age of 18; if you are younger than 47, ask your healthcare provider if you need a Pneumonia shot.  Take these steps  Don't smoke- If you do smoke, talk to your doctor about quitting.  For tips on how to quit, go to www.smokefree.gov or call 1-800-QUIT-NOW.  Be physically active- Exercise 5 days a week for at  least 30 minutes.  If you are not already physically active start slow and gradually work up to 30 minutes of moderate physical activity.  Examples of moderate activity include walking briskly, mowing the yard, dancing, swimming, bicycling, etc.  Eat a healthy diet- Eat a variety of healthy food such as fruits, vegetables, low fat milk, low fat cheese, yogurt, lean meant, poultry, fish, beans, tofu, etc. For more information go to www.thenutritionsource.org  Drink alcohol in moderation- Limit alcohol intake to less than two drinks a day. Never drink and drive.  Dentist- Brush and floss twice daily; visit your dentist twice a year.  Depression- Your emotional health is as important as your physical health. If you're feeling down, or losing interest in things you would normally enjoy please talk to your healthcare provider.  Eye exam- Visit your eye doctor every year.  Safe sex- If you may be exposed to a sexually transmitted infection, use a condom.  Seat belts- Seat belts can save your life; always wear one.  Smoke/Carbon Monoxide detectors- These detectors need to be installed on the appropriate level of your home.  Replace batteries at least once a year.  Skin cancer- When out in the sun, cover up and use sunscreen 15 SPF or higher.  Violence- If anyone is threatening you, please tell your healthcare provider.  Living Will/ Health care power of attorney- Speak with your healthcare provider and family.  IF you received an x-ray today, you will receive an invoice from  [facility]. Please contact [facility] at [phone] with questions or concerns regarding your invoice.   IF you received labwork today, you will receive an invoice from [place]. Please contact [facility] at [phone] with questions or concerns regarding your invoice.   Our billing staff will not be able to assist you with questions regarding bills from these companies.  You will be contacted with  the lab results as soon as they are available. The fastest way to get your results is to activate your My Chart account. Instructions are located on the last page of this paperwork. If you have not heard from [place] regarding the results in 2 weeks, please contact this office.

## 2017-07-12 LAB — COMPREHENSIVE METABOLIC PANEL
A/G RATIO: 1.8 (ref 1.2–2.2)
ALT: 23 IU/L (ref 0–44)
AST: 24 IU/L (ref 0–40)
Albumin: 4.4 g/dL (ref 3.6–4.8)
Alkaline Phosphatase: 56 IU/L (ref 39–117)
BILIRUBIN TOTAL: 0.4 mg/dL (ref 0.0–1.2)
BUN/Creatinine Ratio: 13 (ref 10–24)
BUN: 15 mg/dL (ref 8–27)
CHLORIDE: 102 mmol/L (ref 96–106)
CO2: 23 mmol/L (ref 20–29)
Calcium: 9.3 mg/dL (ref 8.6–10.2)
Creatinine, Ser: 1.12 mg/dL (ref 0.76–1.27)
GFR calc non Af Amer: 71 mL/min/{1.73_m2} (ref >59)
GFR, EST AFRICAN AMERICAN: 82 mL/min/{1.73_m2} (ref >59)
Globulin, Total: 2.5 g/dL (ref 1.5–4.5)
Glucose: 83 mg/dL (ref 65–99)
POTASSIUM: 4.5 mmol/L (ref 3.5–5.2)
Sodium: 140 mmol/L (ref 134–144)
TOTAL PROTEIN: 6.9 g/dL (ref 6.0–8.5)

## 2017-07-12 LAB — LIPID PANEL
CHOL/HDL RATIO: 4.5 ratio (ref 0.0–5.0)
Cholesterol, Total: 269 mg/dL — ABNORMAL HIGH (ref 100–199)
HDL: 60 mg/dL (ref >39)
LDL Calculated: 197 mg/dL — ABNORMAL HIGH (ref 0–99)
Triglycerides: 61 mg/dL (ref 0–149)
VLDL CHOLESTEROL CAL: 12 mg/dL (ref 5–40)

## 2017-07-12 LAB — PSA: Prostate Specific Ag, Serum: 1 ng/mL (ref 0.0–4.0)

## 2017-07-25 ENCOUNTER — Encounter: Payer: Self-pay | Admitting: Family Medicine

## 2017-07-25 DIAGNOSIS — E785 Hyperlipidemia, unspecified: Secondary | ICD-10-CM

## 2017-07-28 MED ORDER — ATORVASTATIN CALCIUM 10 MG PO TABS: 10.0000 mg | ORAL_TABLET | Freq: Every day | ORAL | 0 refills | Status: DC

## 2017-09-06 ENCOUNTER — Encounter: Payer: Self-pay | Admitting: Family Medicine

## 2017-09-10 ENCOUNTER — Telehealth: Payer: Self-pay | Admitting: Family Medicine

## 2017-09-10 NOTE — Telephone Encounter (Signed)
Already advised pt that he needs office visit for this via mychart.

## 2017-09-10 NOTE — Telephone Encounter (Signed)
Copied from [place] [id number]. Topic: Quick Communication - See Telephone Encounter >> Sep 10, 2017  9:17 AM Bea Graff, NT wrote: CRM for notification. See Telephone encounter for: 09/10/17. Pt is needing orders for a recheck on his lipid panel. Please call pt once order is in.

## 2017-10-26 ENCOUNTER — Other Ambulatory Visit: Payer: Self-pay | Admitting: Family Medicine

## 2017-10-26 DIAGNOSIS — E785 Hyperlipidemia, unspecified: Secondary | ICD-10-CM

## 2017-11-24 ENCOUNTER — Other Ambulatory Visit: Payer: Self-pay | Admitting: Family Medicine

## 2017-11-24 DIAGNOSIS — E785 Hyperlipidemia, unspecified: Secondary | ICD-10-CM

## 2018-02-01 ENCOUNTER — Other Ambulatory Visit: Payer: Self-pay | Admitting: Family Medicine

## 2018-02-01 DIAGNOSIS — J4530 Mild persistent asthma, uncomplicated: Secondary | ICD-10-CM

## 2018-08-03 ENCOUNTER — Other Ambulatory Visit: Payer: Self-pay | Admitting: Family Medicine

## 2018-08-03 DIAGNOSIS — E785 Hyperlipidemia, unspecified: Secondary | ICD-10-CM

## 2018-08-14 ENCOUNTER — Telehealth (INDEPENDENT_AMBULATORY_CARE_PROVIDER_SITE_OTHER): Payer: BLUE CROSS/BLUE SHIELD | Admitting: Family Medicine

## 2018-08-14 ENCOUNTER — Other Ambulatory Visit: Payer: Self-pay

## 2018-08-14 DIAGNOSIS — J4530 Mild persistent asthma, uncomplicated: Secondary | ICD-10-CM | POA: Diagnosis not present

## 2018-08-14 DIAGNOSIS — M79662 Pain in left lower leg: Secondary | ICD-10-CM | POA: Diagnosis not present

## 2018-08-14 MED ORDER — ALBUTEROL SULFATE HFA 108 (90 BASE) MCG/ACT IN AERS: INHALATION_SPRAY | RESPIRATORY_TRACT | 0 refills | Status: DC

## 2018-08-14 MED ORDER — FLUTICASONE PROPIONATE HFA 110 MCG/ACT IN AERO: 2.0000 | INHALATION_SPRAY | Freq: Two times a day (BID) | RESPIRATORY_TRACT | 3 refills | Status: DC

## 2018-08-14 NOTE — Progress Notes (Signed)
Virtual Visit via Video Note  I connected with Blake Nash on 08/14/18 at 1:52 PM by a video enabled telemedicine application and verified that I am speaking with the correct person using two identifiers.   I discussed the limitations, risks, security and privacy concerns of performing an evaluation and management service by telephone and the availability of in person appointments. I also discussed with the patient that there may be a patient responsible charge related to this service. The patient expressed understanding and agreed to proceed, consent obtained  Chief complaint: L calf pain  History of Present Illness: Blake Nash is a 63 y.o. male  L calf pain: Initially started with feeling of a charlie horse/cramp in left calf that would come and go over past year. Usually doesn't last long.   Now with pain in left calf for past week, now affecting how he is walking somewhat. Doesn't feel like muscle tightening, just deeper in calf. Does seem to be swollen on left side.  NKI, but may have stepped into a hole in the ground, but no known injury.  Tried compression stocking past few days -min changes.  Tried elevation and heat, ibuprofen- slight better. Not as intense, but still limping. No recent prolonged car travel or air travel, no recent surgery, no hx of DVT.  No chest pains, no dyspnea.  Seems to be lessening.   Asthma: Last discussed March 2019.  Using flovent in spring and fall usually - has been managing overall. Ventolin 2 times per week during current pollen flair. Still using nasonex and zyrtec if outside.   Past Medical History:  Diagnosis Date  . Allergy   . Asthma   . Depression   . History of blood transfusion    1990's   Past Surgical History:  Procedure Laterality Date  . FRACTURE SURGERY    . HERNIA REPAIR    . PILONIDAL CYST EXCISION  1980  . VASECTOMY     Allergies  Allergen Reactions  . Gentamycin [Gentamicin] Other (See Comments)    Eye drop    Prior to Admission medications   Medication Sig Start Date End Date Taking? Authorizing Provider  albuterol (VENTOLIN HFA) 108 (90 Base) MCG/ACT inhaler INHALE 1 TO 2 PUFFS INTO THE LUNGS EVERY 6 HOURS AS NEEDED 02/01/18  Yes Wendie Agreste, MD  atorvastatin (LIPITOR) 10 MG tablet TAKE 1 TABLET BY MOUTH EVERY DAY 11/25/17  Yes Wendie Agreste, MD  cetirizine (ZYRTEC) 10 MG tablet Take 10 mg by mouth as needed for allergies.   Yes [provider]  cromolyn (OPTICROM) 4 % ophthalmic solution Place 1 drop into both eyes 4 (four) times daily.   Yes [provider]  fluticasone (FLOVENT HFA) 110 MCG/ACT inhaler Inhale 2 puffs into the lungs 2 (two) times daily. 04/23/17  Yes Wendie Agreste, MD  mometasone (NASONEX) 50 MCG/ACT nasal spray Place 2 sprays into the nose as needed.   Yes [provider]   Social History   Socioeconomic History  . Marital status: Married    Spouse name: Not on file  . Number of children: Not on file  . Years of education: Not on file  . Highest education level: Not on file  Occupational History  . Not on file  Social Needs  . Financial resource strain: Not on file  . Food insecurity:    Worry: Not on file    Inability: Not on file  . Transportation needs:    Medical:  Not on file    Non-medical: Not on file  Tobacco Use  . Smoking status: Former Smoker    Types: Cigarettes  . Smokeless tobacco: Never Used  . Tobacco comment: quit at age 40  Substance and Sexual Activity  . Alcohol use: Yes    Comment: occ  . Drug use: No  . Sexual activity: Not on file  Lifestyle  . Physical activity:    Days per week: Not on file    Minutes per session: Not on file  . Stress: Not on file  Relationships  . Social connections:    Talks on phone: Not on file    Gets together: Not on file    Attends religious service: Not on file    Active member of club or organization: Not on file    Attends meetings of clubs or organizations:  Not on file    Relationship status: Not on file  . Intimate partner violence:    Fear of current or ex partner: Not on file    Emotionally abused: Not on file    Physically abused: Not on file    Forced sexual activity: Not on file  Other Topics Concern  . Not on file  Social History Narrative   Marital status: married      Children: 2 children; +grandchildren 4      Lives:  With wife.      Employment:  Working; Surveyor, quantity businesses      Tobacco: quit age 67      Alcohol: socially      Exercise: walks dogs three times per day; [place]. 1.5 miles per day.  Training on the farm on weekends.    Observations/Objective: By video: no leg wounds, erythema, rash.  Calf circumference with home tape measure at 15cm below patella 38-39cm on left, 39 cm on right.   Speaking normally, normal resp effort. No audible wheeze. No distress.   Assessment and Plan: Pain of left calf  - NKI, but improving. No measured swelling as above, no known risk factors for DVT. Option of U/S discussed, but as improving, and other info above, decided against at this time. Plans to update me by Mychart in next few days. ER precautions given.   Mild persistent asthma without complication  - stable with flovent and allergy treatment. Refilled albuterol for prn use and RTC precautions.   - discussed using flonase prior to allergen exposure and use of flovent throughout allergy season as preventative.  Follow Up Instructions: Prn.    I discussed the assessment and treatment plan with the patient. The patient was provided an opportunity to ask questions and all were answered. The patient agreed with the plan and demonstrated an understanding of the instructions.   The patient was advised to call back or seek an in-person evaluation if the symptoms worsen or if the condition fails to improve as anticipated.  I provided 22 minutes of non-face-to-face time during this encounter.   Wendie Agreste, MD

## 2018-08-14 NOTE — Patient Instructions (Signed)
Let me know how the calf is doing in the next few days. Ok to continue same home treatments for now.  Return to the clinic or go to the nearest emergency room if any of your symptoms worsen or new symptoms occur.  No change in asthma meds for now.

## 2018-08-14 NOTE — Progress Notes (Signed)
Ongoing Left calf pain over a week. used to have charlie horse but now the pain is more apparent.

## 2018-08-17 ENCOUNTER — Encounter: Payer: Self-pay | Admitting: Family Medicine

## 2018-08-30 ENCOUNTER — Other Ambulatory Visit: Payer: Self-pay | Admitting: Family Medicine

## 2018-08-30 DIAGNOSIS — E785 Hyperlipidemia, unspecified: Secondary | ICD-10-CM

## 2019-01-26 DIAGNOSIS — H2513 Age-related nuclear cataract, bilateral: Secondary | ICD-10-CM | POA: Diagnosis not present

## 2019-01-26 DIAGNOSIS — H02834 Dermatochalasis of left upper eyelid: Secondary | ICD-10-CM | POA: Diagnosis not present

## 2019-01-26 DIAGNOSIS — H02831 Dermatochalasis of right upper eyelid: Secondary | ICD-10-CM | POA: Diagnosis not present

## 2019-05-14 ENCOUNTER — Ambulatory Visit (AMBULATORY_SURGERY_CENTER): Payer: PRIVATE HEALTH INSURANCE | Admitting: *Deleted

## 2019-05-14 ENCOUNTER — Other Ambulatory Visit: Payer: Self-pay

## 2019-05-14 VITALS — Temp 97.3°F | Ht 67.0 in | Wt 192.0 lb

## 2019-05-14 DIAGNOSIS — Z1159 Encounter for screening for other viral diseases: Secondary | ICD-10-CM

## 2019-05-14 DIAGNOSIS — Z86010 Personal history of colonic polyps: Secondary | ICD-10-CM

## 2019-05-14 MED ORDER — SUPREP BOWEL PREP KIT 17.5-3.13-1.6 GM/177ML PO SOLN: 1.0000 | Freq: Once | ORAL | 0 refills | Status: AC

## 2019-05-14 NOTE — Progress Notes (Signed)

## 2019-05-22 ENCOUNTER — Encounter: Payer: Self-pay | Admitting: Gastroenterology

## 2019-05-26 ENCOUNTER — Other Ambulatory Visit: Payer: Self-pay | Admitting: Gastroenterology

## 2019-05-26 ENCOUNTER — Ambulatory Visit (INDEPENDENT_AMBULATORY_CARE_PROVIDER_SITE_OTHER): Payer: PRIVATE HEALTH INSURANCE

## 2019-05-26 DIAGNOSIS — Z1159 Encounter for screening for other viral diseases: Secondary | ICD-10-CM

## 2019-05-27 LAB — SARS CORONAVIRUS 2 (TAT 6-24 HRS): SARS Coronavirus 2: NEGATIVE

## 2019-05-29 ENCOUNTER — Encounter: Payer: Self-pay | Admitting: Gastroenterology

## 2019-05-29 ENCOUNTER — Other Ambulatory Visit: Payer: Self-pay

## 2019-05-29 ENCOUNTER — Ambulatory Visit (AMBULATORY_SURGERY_CENTER): Payer: PRIVATE HEALTH INSURANCE | Admitting: Gastroenterology

## 2019-05-29 VITALS — BP 100/75 | HR 60 | Temp 97.5°F | Resp 26 | Ht 67.0 in | Wt 192.0 lb

## 2019-05-29 DIAGNOSIS — Z86010 Personal history of colonic polyps: Secondary | ICD-10-CM

## 2019-05-29 MED ORDER — SODIUM CHLORIDE 0.9 % IV SOLN: 500.0000 mL | Freq: Once | INTRAVENOUS | Status: DC

## 2019-05-29 NOTE — Progress Notes (Signed)
Temp  JB  VS   KA  Pt's states no medical or surgical changes since previsit or office visit.

## 2019-05-29 NOTE — Op Note (Signed)
[place] Patient Name: Blake Nash Procedure Date: 05/29/2019 1:21 PM MRN: [id number] Endoscopist: Milus Banister , MD Age: 64 Referring MD:  Date of Birth: March 02, 1956 Gender: Male Account #: [id number] Procedure:                Colonoscopy Indications:              High risk colon cancer surveillance: Personal                            history of colonic polyps; colonoscopy 02/2016                            pedunculated 56mm TVA "with at least HGD", margin                            was clear of adenomatous tissue. Repeat colonoscopy                            05/2016 found no polyps Medicines:                Monitored Anesthesia Care Procedure:                Pre-Anesthesia Assessment:                           - Prior to the procedure, a History and Physical                            was performed, and patient medications and                            allergies were reviewed. The patient's tolerance of                            previous anesthesia was also reviewed. The risks                            and benefits of the procedure and the sedation                            options and risks were discussed with the patient.                            All questions were answered, and informed consent                            was obtained. Prior Anticoagulants: The patient has                            taken no previous anticoagulant or antiplatelet                            agents. ASA Grade Assessment: II - A patient with  mild systemic disease. After reviewing the risks                            and benefits, the patient was deemed in                            satisfactory condition to undergo the procedure.                           After obtaining informed consent, the colonoscope                            was passed under direct vision. Throughout the                            procedure, the patient's blood pressure, pulse,  and                            oxygen saturations were monitored continuously. The                            Colonoscope was introduced through the anus and                            advanced to the the cecum, identified by                            appendiceal orifice and ileocecal valve. The                            colonoscopy was performed without difficulty. The                            patient tolerated the procedure well. The quality                            of the bowel preparation was good. Scope In: 1:26:01 PM Scope Out: 1:42:48 PM Scope Withdrawal Time: 0 hours 10 minutes 22 seconds  Total Procedure Duration: 0 hours 16 minutes 47 seconds  Findings:                 Multiple small and large-mouthed diverticula were                            found in the left colon.                           The exam was otherwise without abnormality on                            direct and retroflexion views. Complications:            No immediate complications. Estimated blood loss:  None. Estimated Blood Loss:     Estimated blood loss: none. Impression:               - Diverticulosis in the left colon.                           - The examination was otherwise normal on direct                            and retroflexion views.                           - No specimens collected. Recommendation:           - Patient has a contact number available for                            emergencies. The signs and symptoms of potential                            delayed complications were discussed with the                            patient. Return to normal activities tomorrow.                            Written discharge instructions were provided to the                            patient.                           - Resume previous diet.                           - Continue present medications.                           - Repeat colonoscopy in 5 years for  surveillance. Milus Banister, MD 05/29/2019 1:46:46 PM This report has been signed electronically.

## 2019-05-29 NOTE — Progress Notes (Signed)
PT taken to PACU. Monitors in place. VSS. Report given to RN. 

## 2019-05-29 NOTE — Patient Instructions (Signed)
Please see handouts given to you on Diverticulosis.    YOU HAD AN ENDOSCOPIC PROCEDURE TODAY AT [facility]:   Refer to the procedure report that was given to you for any specific questions about what was found during the examination.  If the procedure report does not answer your questions, please call your gastroenterologist to clarify.  If you requested that your care partner not be given the details of your procedure findings, then the procedure report has been included in a sealed envelope for you to review at your convenience later.  YOU SHOULD EXPECT: Some feelings of bloating in the abdomen. Passage of more gas than usual.  Walking can help get rid of the air that was put into your GI tract during the procedure and reduce the bloating. If you had a lower endoscopy (such as a colonoscopy or flexible sigmoidoscopy) you may notice spotting of blood in your stool or on the toilet paper. If you underwent a bowel prep for your procedure, you may not have a normal bowel movement for a few days.  Please Note:  You might notice some irritation and congestion in your nose or some drainage.  This is from the oxygen used during your procedure.  There is no need for concern and it should clear up in a day or so.  SYMPTOMS TO REPORT IMMEDIATELY:   Following lower endoscopy (colonoscopy or flexible sigmoidoscopy):  Excessive amounts of blood in the stool  Significant tenderness or worsening of abdominal pains  Swelling of the abdomen that is new, acute  Fever of 100F or higher   For urgent or emergent issues, a gastroenterologist can be reached at any hour by calling [phone].   DIET:  We do recommend a small meal at first, but then you may proceed to your regular diet.  Drink plenty of fluids but you should avoid alcoholic beverages for 24 hours.  ACTIVITY:  You should plan to take it easy for the rest of today and you should NOT DRIVE or use heavy machinery until tomorrow  (because of the sedation medicines used during the test).    FOLLOW UP: Our staff will call the number listed on your records 48-72 hours following your procedure to check on you and address any questions or concerns that you may have regarding the information given to you following your procedure. If we do not reach you, we will leave a message.  We will attempt to reach you two times.  During this call, we will ask if you have developed any symptoms of COVID 19. If you develop any symptoms (ie: fever, flu-like symptoms, shortness of breath, cough etc.) before then, please call [phone].  If you test positive for Covid 19 in the 2 weeks post procedure, please call and report this information to [place].    If any biopsies were taken you will be contacted by phone or by letter within the next 1-3 weeks.  Please call us at [phone] if you have not heard about the biopsies in 3 weeks.    SIGNATURES/CONFIDENTIALITY: You and/or your care partner have signed paperwork which will be entered into your electronic medical record.  These signatures attest to the fact that that the information above on your After Visit Summary has been reviewed and is understood.  Full responsibility of the confidentiality of this discharge information lies with you and/or your care-partner.

## 2019-06-02 ENCOUNTER — Telehealth: Payer: Self-pay

## 2019-06-02 NOTE — Telephone Encounter (Signed)
Left message on follow up call. 

## 2019-06-02 NOTE — Telephone Encounter (Signed)
  Follow up Call-  Call back number 05/29/2019  Post procedure Call Back phone  # [phone]  Permission to leave phone message Yes  Some recent data might be hidden     Patient questions:  Do you have a fever, pain , or abdominal swelling? No. Pain Score  0 *  Have you tolerated food without any problems? Yes.    Have you been able to return to your normal activities? Yes.    Do you have any questions about your discharge instructions: Diet   No. Medications  No. Follow up visit  No.  Do you have questions or concerns about your Care? No.  Actions: * If pain score is 4 or above: No action needed, pain <4.  1. Have you developed a fever since your procedure? no  2.   Have you had an respiratory symptoms (SOB or cough) since your procedure? no  3.   Have you tested positive for COVID 19 since your procedure no  4.   Have you had any family members/close contacts diagnosed with the COVID 19 since your procedure?  no   If yes to any of these questions please route to Joylene John, RN and Alphonsa Gin, Therapist, sports.

## 2019-12-21 ENCOUNTER — Encounter: Payer: Self-pay | Admitting: Family Medicine

## 2019-12-21 ENCOUNTER — Other Ambulatory Visit: Payer: Self-pay

## 2019-12-21 ENCOUNTER — Ambulatory Visit (INDEPENDENT_AMBULATORY_CARE_PROVIDER_SITE_OTHER): Payer: PRIVATE HEALTH INSURANCE | Admitting: Family Medicine

## 2019-12-21 ENCOUNTER — Ambulatory Visit (INDEPENDENT_AMBULATORY_CARE_PROVIDER_SITE_OTHER): Payer: PRIVATE HEALTH INSURANCE

## 2019-12-21 VITALS — BP 99/62 | HR 97 | Temp 98.0°F | Ht 67.0 in | Wt 186.0 lb

## 2019-12-21 DIAGNOSIS — J4530 Mild persistent asthma, uncomplicated: Secondary | ICD-10-CM

## 2019-12-21 DIAGNOSIS — R05 Cough: Secondary | ICD-10-CM

## 2019-12-21 DIAGNOSIS — R059 Cough, unspecified: Secondary | ICD-10-CM

## 2019-12-21 DIAGNOSIS — E785 Hyperlipidemia, unspecified: Secondary | ICD-10-CM | POA: Diagnosis not present

## 2019-12-21 MED ORDER — FLUTICASONE PROPIONATE HFA 110 MCG/ACT IN AERO: 2.0000 | INHALATION_SPRAY | Freq: Two times a day (BID) | RESPIRATORY_TRACT | 5 refills | Status: DC

## 2019-12-21 MED ORDER — ALBUTEROL SULFATE HFA 108 (90 BASE) MCG/ACT IN AERS: INHALATION_SPRAY | RESPIRATORY_TRACT | 1 refills | Status: DC

## 2019-12-21 NOTE — Progress Notes (Signed)
Subjective:  Patient ID: Blake Nash, male    DOB: 08/09/1955  Age: 64 y.o. MRN: [id number]  CC:  Chief Complaint  Patient presents with  . Asthma    Pt states when he lays down he notices a little bit of flem, but not when he is standing or sitting up. pt states this hasn't happened the past few nights. pt would like provider to listen to his lungs. pt isn't fasting currently.    HPI Blake Nash presents for   Mild intermittent asthma: Last discussed at telemedicine visit in April 2020.  Use Flovent in spring and fall usually which had been managing his symptoms, Ventolin 2 times per week during pollen flare, used Nasonex and Zyrtec if outside. Had some flair with particulates in air. Monitors weather and avoids if possible. phlegm at times, min wheeze. Min cough.  flovent twice about a week and a half ago. No need for ventolin.  Some phlegm in chest past week or so, not last few days. More phlegm with thick air/dust in air.  No PND, no orthopnea, no chest pain. Denies dyspnea on exertion.  No fever/dyspena/taste or smell change.  nasonex few weeks ago. Zyrtec last spring -- allergies doing ok.  Has Moderna covid vaccine.   Hyperlipidemia: Lipitor 10 mg previously, no recent lipid testing. Off for a few years.  No side effects, just ran out. No known side effects on meds.  Lab Results  Component Value Date   CHOL 269 (H) 07/11/2017   HDL 60 07/11/2017   LDLCALC 197 (H) 07/11/2017   TRIG 61 07/11/2017   CHOLHDL 4.5 07/11/2017   Lab Results  Component Value Date   ALT 23 07/11/2017   AST 24 07/11/2017   ALKPHOS 56 07/11/2017   BILITOT 0.4 07/11/2017      History Patient Active Problem List   Diagnosis Date Noted  . Asthma 01/29/2012  . Allergic rhinitis 01/29/2012   Past Medical History:  Diagnosis Date  . Allergy   . Asthma   . Depression    minor- resolved per pt   . GERD (gastroesophageal reflux disease)    occasionally with diet changes   . History  of blood transfusion    1990's  . Hyperlipidemia    Past Surgical History:  Procedure Laterality Date  . COLONOSCOPY    . FRACTURE SURGERY    . HERNIA REPAIR    . PILONIDAL CYST EXCISION  1980  . POLYPECTOMY    . VASECTOMY     Allergies  Allergen Reactions  . Gentamycin [Gentamicin] Other (See Comments)    Eye drop   Prior to Admission medications   Medication Sig Start Date End Date Taking? Authorizing Provider  albuterol (VENTOLIN HFA) 108 (90 Base) MCG/ACT inhaler INHALE 1 TO 2 PUFFS INTO THE LUNGS EVERY 6 HOURS AS NEEDED 08/14/18  Yes Wendie Agreste, MD  Ascorbic Acid (VITAMIN C) 1000 MG tablet Take 1,000 mg by mouth daily.   Yes [provider]  cetirizine (ZYRTEC) 10 MG tablet Take 10 mg by mouth as needed for allergies.   Yes [provider]  cromolyn (OPTICROM) 4 % ophthalmic solution Place 1 drop into both eyes 4 (four) times daily.   Yes [provider]  Multiple Vitamin (MULTIVITAMIN) tablet Take 1 tablet by mouth daily. Over 50+ daily   Yes [provider]  orlistat (ALLI) 60 MG capsule Take 60 mg by mouth 3 (three) times daily with meals.   Yes  [provider]  atorvastatin (LIPITOR) 10 MG tablet TAKE 1 TABLET BY MOUTH EVERY DAY Patient not taking: Reported on 12/21/2019 11/25/17   Wendie Agreste, MD  ferrous sulfate 325 (65 FE) MG tablet Take 325 mg by mouth daily with breakfast. Patient not taking: Reported on 12/21/2019    [provider]  fluticasone (FLOVENT HFA) 110 MCG/ACT inhaler Inhale 2 puffs into the lungs 2 (two) times daily. Patient not taking: Reported on 05/29/2019 08/14/18   Wendie Agreste, MD  mometasone (NASONEX) 50 MCG/ACT nasal spray Place 2 sprays into the nose as needed. Patient not taking: Reported on 12/21/2019    [provider]   Social History   Socioeconomic History  . Marital status: Married    Spouse name: Not on file  . Number of children: Not on file  . Years of  education: Not on file  . Highest education level: Not on file  Occupational History  . Not on file  Tobacco Use  . Smoking status: Former Smoker    Types: Cigarettes  . Smokeless tobacco: Never Used  . Tobacco comment: quit at age 59  Substance and Sexual Activity  . Alcohol use: Yes    Comment: occ  . Drug use: No  . Sexual activity: Not on file  Other Topics Concern  . Not on file  Social History Narrative   Marital status: married      Children: 2 children; +grandchildren 4      Lives:  With wife.      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   Social Determinants of Health   Financial Resource Strain:   . Difficulty of Paying Living Expenses:   Food Insecurity:   . Worried About Charity fundraiser in the Last Year:   . Arboriculturist in the Last Year:   Transportation Needs:   . Film/video editor (Medical):   Marland Kitchen Lack of Transportation (Non-Medical):   Physical Activity:   . Days of Exercise per Week:   . Minutes of Exercise per Session:   Stress:   . Feeling of Stress :   Social Connections:   . Frequency of Communication with Friends and Family:   . Frequency of Social Gatherings with Friends and Family:   . Attends Religious Services:   . Active Member of Clubs or Organizations:   . Attends Archivist Meetings:   Marland Kitchen Marital Status:   Intimate Partner Violence:   . Fear of Current or Ex-Partner:   . Emotionally Abused:   Marland Kitchen Physically Abused:   . Sexually Abused:     Review of Systems Per HPI.   Objective:   Vitals:   12/21/19 1549  BP: 99/62  Pulse: 97  Temp: 98 F (36.7 C)  TempSrc: Temporal  SpO2: 94%  Weight: 186 lb (84.4 kg)  Height: 5\' 7"  (1.702 m)     Physical Exam Vitals reviewed.  Constitutional:      Appearance: He is well-developed.  HENT:     Head:  Normocephalic and atraumatic.  Eyes:     Pupils: Pupils are equal, round, and reactive to light.  Neck:     Vascular: No carotid bruit or JVD.  Cardiovascular:     Rate  and Rhythm: Normal rate and regular rhythm.     Heart sounds: Normal heart sounds. No murmur heard.   Pulmonary:     Effort: Pulmonary effort is normal. No respiratory distress.     Breath sounds: Normal breath sounds. No stridor. No wheezing, rhonchi or rales.  Musculoskeletal:     Right lower leg: Edema (trace bil nonpitting. ) present.     Left lower leg: Edema present.  Skin:    General: Skin is warm and dry.  Neurological:     Mental Status: He is alert and oriented to person, place, and time.    DG Chest 2 View  Result Date: 12/21/2019 CLINICAL DATA:  64 year old male with cough. EXAM: CHEST - 2 VIEW COMPARISON:  None. FINDINGS: No focal consolidation, pleural effusion, pneumothorax. Background of emphysema. The cardiac silhouette is within limits. No acute osseous pathology. Degenerative changes of the spine. IMPRESSION: No active cardiopulmonary disease. Electronically Signed   By: Anner Crete M.D.   On: 12/21/2019 17:22     Assessment & Plan:  Blake Nash is a 64 y.o. male . Cough - Plan: DG Chest 2 View Mild persistent asthma without complication - Plan: albuterol (VENTOLIN HFA) 108 (90 Base) MCG/ACT inhaler, DG Chest 2 View, fluticasone (FLOVENT HFA) 110 MCG/ACT inhaler  -Chest x-ray without focal consolidation or effusion.  Possible background emphysema.  Lungs clear on exam.  Continue Flovent, daily use or episodic if well controlled, albuterol if needed with RTC precautions.  -Continue other allergy treatments as needed.  Hyperlipidemia, unspecified hyperlipidemia type - Plan: Lipid panel, Comprehensive metabolic panel  -Check labs, but based on previous readings would recommend statin, and likely higher dose.  Plans on lab only visit for fasting labs.  Meds ordered this encounter   Medications  . albuterol (VENTOLIN HFA) 108 (90 Base) MCG/ACT inhaler    Sig: INHALE 1 TO 2 PUFFS INTO THE LUNGS EVERY 6 HOURS AS NEEDED    Dispense:  18 g    Refill:  1  . fluticasone (FLOVENT HFA) 110 MCG/ACT inhaler    Sig: Inhale 2 puffs into the lungs 2 (two) times daily.    Dispense:  12 g    Refill:  5   Patient Instructions     If you are feeling more congestion/cough/wheeze, try flovent every day. Albuterol as needed. Follow up if new or worsnening shortness of breath.  Lab only visit in next few days. If cholesterol still elevated, would recommend restarting cholesterol med but can discuss dose options.   Recheck in 6 weeks, but let me know if questions sooner.   If you have lab work done today you will be contacted with your lab results within the next 2 weeks.  If you have not heard from [place] then please contact us. The fastest way to get your results is to register for My Chart.   IF you received an x-ray today, you will receive an invoice from [facility]. Please contact [facility] at [phone] with questions or concerns regarding your invoice.   IF you received labwork today, you will receive an invoice from [place]. Please contact [facility] at [phone] with questions or concerns regarding your invoice.   Our billing staff will not be able to assist you with questions regarding bills from these companies.  You will be contacted with the lab results as soon as they are available. The fastest way to get your results is to activate your My Chart account. Instructions are located on  the last page of this paperwork. If you have not heard from [place] regarding the results in 2 weeks, please contact this office.         Signed, [name], MD [facility]

## 2019-12-21 NOTE — Patient Instructions (Addendum)
   If you are feeling more congestion/cough/wheeze, try flovent every day. Albuterol as needed. Follow up if new or worsnening shortness of breath.  Lab only visit in next few days. If cholesterol still elevated, would recommend restarting cholesterol med but can discuss dose options.   Recheck in 6 weeks, but let me know if questions sooner.   If you have lab work done today you will be contacted with your lab results within the next 2 weeks.  If you have not heard from [place] then please contact us. The fastest way to get your results is to register for My Chart.   IF you received an x-ray today, you will receive an invoice from [facility]. Please contact [facility] at [phone] with questions or concerns regarding your invoice.   IF you received labwork today, you will receive an invoice from [facility]. Please contact [facility] at [phone] with questions or concerns regarding your invoice.   Our billing staff will not be able to assist you with questions regarding bills from these companies.  You will be contacted with the lab results as soon as they are available. The fastest way to get your results is to activate your My Chart account. Instructions are located on the last page of this paperwork. If you have not heard from [place] regarding the results in 2 weeks, please contact this office.

## 2019-12-22 ENCOUNTER — Encounter: Payer: Self-pay | Admitting: Family Medicine

## 2019-12-25 ENCOUNTER — Ambulatory Visit (INDEPENDENT_AMBULATORY_CARE_PROVIDER_SITE_OTHER): Payer: PRIVATE HEALTH INSURANCE | Admitting: Family Medicine

## 2019-12-25 ENCOUNTER — Other Ambulatory Visit: Payer: Self-pay

## 2019-12-25 DIAGNOSIS — E785 Hyperlipidemia, unspecified: Secondary | ICD-10-CM

## 2019-12-25 LAB — SPECIMEN STATUS REPORT

## 2019-12-26 ENCOUNTER — Encounter: Payer: Self-pay | Admitting: Family Medicine

## 2019-12-29 LAB — COMPREHENSIVE METABOLIC PANEL
ALT: 16 IU/L (ref 0–44)
AST: 19 IU/L (ref 0–40)
Albumin/Globulin Ratio: 1.8 (ref 1.2–2.2)
Albumin: 4.1 g/dL (ref 3.8–4.8)
Alkaline Phosphatase: 61 IU/L (ref 48–121)
BUN/Creatinine Ratio: 18 (ref 10–24)
BUN: 19 mg/dL (ref 8–27)
Bilirubin Total: 0.3 mg/dL (ref 0.0–1.2)
CO2: 24 mmol/L (ref 20–29)
Calcium: 9.1 mg/dL (ref 8.6–10.2)
Chloride: 104 mmol/L (ref 96–106)
Creatinine, Ser: 1.03 mg/dL (ref 0.76–1.27)
GFR calc Af Amer: 89 mL/min/{1.73_m2} (ref >59)
GFR calc non Af Amer: 77 mL/min/{1.73_m2} (ref >59)
Globulin, Total: 2.3 g/dL (ref 1.5–4.5)
Glucose: 92 mg/dL (ref 65–99)
Potassium: 4.8 mmol/L (ref 3.5–5.2)
Sodium: 139 mmol/L (ref 134–144)
Total Protein: 6.4 g/dL (ref 6.0–8.5)

## 2019-12-29 LAB — LIPID PANEL
Chol/HDL Ratio: 4.7 ratio (ref 0.0–5.0)
Cholesterol, Total: 250 mg/dL — ABNORMAL HIGH (ref 100–199)
HDL: 53 mg/dL (ref >39)
LDL Chol Calc (NIH): 183 mg/dL — ABNORMAL HIGH (ref 0–99)
Triglycerides: 80 mg/dL (ref 0–149)
VLDL Cholesterol Cal: 14 mg/dL (ref 5–40)

## 2020-01-05 ENCOUNTER — Other Ambulatory Visit: Payer: Self-pay | Admitting: Family Medicine

## 2020-01-05 DIAGNOSIS — E785 Hyperlipidemia, unspecified: Secondary | ICD-10-CM

## 2020-01-05 MED ORDER — ATORVASTATIN CALCIUM 10 MG PO TABS: 10.0000 mg | ORAL_TABLET | Freq: Every day | ORAL | 1 refills | Status: DC

## 2020-01-29 ENCOUNTER — Other Ambulatory Visit: Payer: Self-pay

## 2020-01-29 ENCOUNTER — Ambulatory Visit (INDEPENDENT_AMBULATORY_CARE_PROVIDER_SITE_OTHER): Payer: PRIVATE HEALTH INSURANCE | Admitting: Family Medicine

## 2020-01-29 ENCOUNTER — Encounter: Payer: Self-pay | Admitting: Family Medicine

## 2020-01-29 VITALS — BP 102/64 | HR 62 | Temp 97.8°F | Ht 68.0 in | Wt 190.8 lb

## 2020-01-29 DIAGNOSIS — E785 Hyperlipidemia, unspecified: Secondary | ICD-10-CM

## 2020-01-29 NOTE — Progress Notes (Signed)
Lab only visit, patient not seen by me.

## 2020-01-30 LAB — LIPID PANEL
Chol/HDL Ratio: 3.5 ratio (ref 0.0–5.0)
Cholesterol, Total: 200 mg/dL — ABNORMAL HIGH (ref 100–199)
HDL: 57 mg/dL (ref >39)
LDL Chol Calc (NIH): 132 mg/dL — ABNORMAL HIGH (ref 0–99)
Triglycerides: 61 mg/dL (ref 0–149)
VLDL Cholesterol Cal: 11 mg/dL (ref 5–40)

## 2020-02-10 ENCOUNTER — Encounter: Payer: Self-pay | Admitting: Family Medicine

## 2020-02-10 DIAGNOSIS — E785 Hyperlipidemia, unspecified: Secondary | ICD-10-CM

## 2020-03-01 MED ORDER — ATORVASTATIN CALCIUM 10 MG PO TABS: 20.0000 mg | ORAL_TABLET | Freq: Every day | ORAL | 1 refills | Status: DC

## 2020-03-10 MED ORDER — ATORVASTATIN CALCIUM 20 MG PO TABS: 20.0000 mg | ORAL_TABLET | Freq: Every day | ORAL | 1 refills | Status: DC

## 2020-08-18 ENCOUNTER — Ambulatory Visit: Payer: Self-pay | Attending: Internal Medicine

## 2020-08-18 DIAGNOSIS — Z23 Encounter for immunization: Secondary | ICD-10-CM

## 2020-08-18 NOTE — Progress Notes (Signed)
   Covid-19 Vaccination Clinic  Name:  Blake Nash    MRN: [id number] DOB: [birth year]  08/18/2020  Mr. Blake Nash was observed post Covid-19 immunization for 15 minutes without incident. He was provided with Vaccine Information Sheet and instruction to access the V-Safe system.   Mr. Blake Nash was instructed to call 911 with any severe reactions post vaccine: Marland Kitchen Difficulty breathing  . Swelling of face and throat  . A fast heartbeat  . A bad rash all over body  . Dizziness and weakness   Immunizations Administered    Name Date Dose VIS Date Route   Moderna Covid-19 Booster Vaccine 08/18/2020 11:50 AM 0.25 mL 02/24/2020 Intramuscular   Manufacturer: Moderna   Lot: [id number]   [place]: [id number]

## 2020-08-22 ENCOUNTER — Other Ambulatory Visit (HOSPITAL_BASED_OUTPATIENT_CLINIC_OR_DEPARTMENT_OTHER): Payer: Self-pay

## 2020-08-22 MED ORDER — PFIZER-BIONT COVID-19 VAC-TRIS 30 MCG/0.3ML IM SUSP
INTRAMUSCULAR | 0 refills | Status: DC
  Filled 2020-08-22: qty 0.25, 1d supply, fill #0

## 2020-09-01 ENCOUNTER — Other Ambulatory Visit: Payer: Self-pay | Admitting: Family Medicine

## 2020-09-01 DIAGNOSIS — E785 Hyperlipidemia, unspecified: Secondary | ICD-10-CM

## 2020-12-03 ENCOUNTER — Other Ambulatory Visit: Payer: Self-pay | Admitting: Family Medicine

## 2020-12-03 DIAGNOSIS — E785 Hyperlipidemia, unspecified: Secondary | ICD-10-CM

## 2020-12-09 ENCOUNTER — Ambulatory Visit (INDEPENDENT_AMBULATORY_CARE_PROVIDER_SITE_OTHER): Payer: 59 | Admitting: Family Medicine

## 2020-12-09 ENCOUNTER — Other Ambulatory Visit: Payer: Self-pay

## 2020-12-09 DIAGNOSIS — J4530 Mild persistent asthma, uncomplicated: Secondary | ICD-10-CM

## 2020-12-09 DIAGNOSIS — E785 Hyperlipidemia, unspecified: Secondary | ICD-10-CM | POA: Diagnosis not present

## 2020-12-09 LAB — COMPREHENSIVE METABOLIC PANEL
ALT: 22 U/L (ref 0–53)
AST: 23 U/L (ref 0–37)
Albumin: 4.4 g/dL (ref 3.5–5.2)
Alkaline Phosphatase: 63 U/L (ref 39–117)
BUN: 15 mg/dL (ref 6–23)
CO2: 28 mEq/L (ref 19–32)
Calcium: 9.3 mg/dL (ref 8.4–10.5)
Chloride: 102 mEq/L (ref 96–112)
Creatinine, Ser: 1.07 mg/dL (ref 0.40–1.50)
GFR: 73.21 mL/min (ref >60.00)
Glucose, Bld: 85 mg/dL (ref 70–99)
Potassium: 4.4 mEq/L (ref 3.5–5.1)
Sodium: 136 mEq/L (ref 135–145)
Total Bilirubin: 0.6 mg/dL (ref 0.2–1.2)
Total Protein: 6.9 g/dL (ref 6.0–8.3)

## 2020-12-09 LAB — LIPID PANEL
Cholesterol: 168 mg/dL (ref 0–200)
HDL: 52.7 mg/dL (ref >39.00)
LDL Cholesterol: 101 mg/dL — ABNORMAL HIGH (ref 0–99)
NonHDL: 115.56
Total CHOL/HDL Ratio: 3
Triglycerides: 73 mg/dL (ref 0.0–149.0)
VLDL: 14.6 mg/dL (ref 0.0–40.0)

## 2020-12-09 MED ORDER — ALBUTEROL SULFATE HFA 108 (90 BASE) MCG/ACT IN AERS: INHALATION_SPRAY | RESPIRATORY_TRACT | 1 refills | Status: DC

## 2020-12-09 MED ORDER — FLUTICASONE PROPIONATE HFA 110 MCG/ACT IN AERO: 2.0000 | INHALATION_SPRAY | Freq: Two times a day (BID) | RESPIRATORY_TRACT | 5 refills | Status: DC

## 2020-12-09 MED ORDER — ATORVASTATIN CALCIUM 20 MG PO TABS: 20.0000 mg | ORAL_TABLET | Freq: Every day | ORAL | 1 refills | Status: DC

## 2020-12-09 NOTE — Progress Notes (Signed)
Subjective:  Patient ID: Blake Nash, male    DOB: [birth year]  Age: 65 y.o. MRN: [id number]  CC:  Chief Complaint  Patient presents with   Asthma    Pt requesting refills on inhalers due to fall approaching pt states sxs worst in fall. Doing well no new concerns    Hyperlipidemia    Pt here for refill atorvastatin no concerns pt doing well no side effects.     HPI [name] presents for   Asthma, mild persistent Has Flovent HFA 110 mcg - has not needed during the summer. Better with weight loss, dietary changes. Whole 30 diet.  Albuterol use - none needed recently.  Symptoms usually worse in the fall Zyrtec 10 mg, Nasonex nasal spray previously if needed only in the fall.   Hyperlipidemia: Lipitor 20 mg daily No new myalgias or side effects. Banana this morning.   Lab Results  Component Value Date   CHOL 200 (H) 01/29/2020   HDL 57 01/29/2020   LDLCALC 132 (H) 01/29/2020   TRIG 61 01/29/2020   CHOLHDL 3.5 01/29/2020   Lab Results  Component Value Date   ALT 16 12/25/2019   AST 19 12/25/2019   ALKPHOS 61 12/25/2019   BILITOT 0.3 12/25/2019      History Patient Active Problem List   Diagnosis Date Noted   Asthma 01/29/2012   Allergic rhinitis 01/29/2012   Past Medical History:  Diagnosis Date   Allergy    Asthma    Depression    minor- resolved per pt    GERD (gastroesophageal reflux disease)    occasionally with diet changes    History of blood transfusion    1990's   Hyperlipidemia    Past Surgical History:  Procedure Laterality Date   COLONOSCOPY     FRACTURE SURGERY     HERNIA REPAIR     PILONIDAL CYST EXCISION  1980   POLYPECTOMY     VASECTOMY     Allergies  Allergen Reactions   Gentamycin [Gentamicin] Other (See Comments)    Eye drop   Prior to Admission medications   Medication Sig Start Date End Date Taking? Authorizing Provider  albuterol (VENTOLIN HFA) 108 (90 Base) MCG/ACT inhaler INHALE 1 TO 2 PUFFS INTO THE LUNGS EVERY  6 HOURS AS NEEDED 12/21/19  Yes Wendie Agreste, MD  atorvastatin (LIPITOR) 20 MG tablet TAKE 1 TABLET BY MOUTH EVERY DAY 09/01/20  Yes Wendie Agreste, MD  cetirizine (ZYRTEC) 10 MG tablet Take 10 mg by mouth as needed for allergies.   Yes [provider]  COVID-19 mRNA Vac-TriS, Pfizer, (PFIZER-BIONT COVID-19 VAC-TRIS) SUSP injection Inject into the muscle. 08/18/20  Yes Carlyle Basques, MD  cromolyn (OPTICROM) 4 % ophthalmic solution Place 1 drop into both eyes 4 (four) times daily.   Yes [provider]  fluticasone (FLOVENT HFA) 110 MCG/ACT inhaler Inhale 2 puffs into the lungs 2 (two) times daily. 12/21/19  Yes Wendie Agreste, MD  mometasone (NASONEX) 50 MCG/ACT nasal spray Place 2 sprays into the nose as needed.    Yes [provider]  Multiple Vitamin (MULTIVITAMIN) tablet Take 1 tablet by mouth daily. Over 50+ daily   Yes [provider]  Ascorbic Acid (VITAMIN C) 1000 MG tablet Take 1,000 mg by mouth daily.    [provider]  ferrous sulfate 325 (65 FE) MG tablet Take 325 mg by mouth daily with breakfast.     [provider]  orlistat (ALLI)  60 MG capsule Take 60 mg by mouth 3 (three) times daily with meals.    [provider]   Social History   Socioeconomic History   Marital status: Married    Spouse name: Not on file   Number of children: Not on file   Years of education: Not on file   Highest education level: Not on file  Occupational History   Not on file  Tobacco Use   Smoking status: Former    Types: Cigarettes   Smokeless tobacco: Never   Tobacco comments:    quit at age 79  Substance and Sexual Activity   Alcohol use: Yes    Comment: occ   Drug use: No   Sexual activity: Not on file  Other Topics Concern   Not on file  Social History Narrative   Marital status: married      Children: 2 children; +grandchildren 4      Lives:  With wife.      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   Social Determinants of Health   Financial Resource Strain: Not on file  Food Insecurity: Not on file  Transportation Needs: Not on file  Physical Activity: Not on file  Stress: Not on file  Social Connections: Not on file  Intimate Partner Violence: Not on file    Review of Systems  Constitutional:  Negative for fatigue and unexpected weight change.  Eyes:  Negative for visual disturbance.  Respiratory:  Negative for cough, chest tightness and shortness of breath.   Cardiovascular:  Negative for chest pain, palpitations and leg swelling.  Gastrointestinal:  Negative for abdominal pain and blood in stool.  Neurological:  Negative for dizziness, light-headedness and headaches.    Objective:   Vitals:   12/09/20 1057  BP: 130/74  Pulse: 71  Resp: 17  Temp: 98.2 F (36.8 C)  TempSrc: Temporal  SpO2: 95%  Weight: 184 lb 12.8 oz (83.8 kg)  Height: '5\' 8"'$  (1.727 m)     Physical Exam Vitals reviewed.  Constitutional:      Appearance: He is well-developed.  HENT:     Head: Normocephalic and atraumatic.  Neck:     Vascular: No carotid bruit or JVD.  Cardiovascular:     Rate and Rhythm: Normal rate and regular rhythm.     Heart sounds: Normal heart sounds. No murmur heard. Pulmonary:     Effort: Pulmonary effort is normal.     Breath sounds: Normal breath sounds. No rales.  Musculoskeletal:     Right lower leg: No edema.     Left lower leg: No edema.  Skin:    General: Skin is warm and dry.  Neurological:     Mental Status: He is alert and oriented to person, place, and time.  Psychiatric:        Mood and Affect: Mood normal.       Assessment & Plan:  WIN Blake Nash is a 65 y.o. male . Hyperlipidemia, unspecified hyperlipidemia type - Plan: atorvastatin (LIPITOR) 20 MG tablet, Lipid panel, Comprehensive  metabolic panel -  Stable, tolerating current regimen. Medications refilled. Labs pending as above.   Mild persistent asthma without complication - Plan: fluticasone (FLOVENT HFA) 110 MCG/ACT inhaler, albuterol (VENTOLIN HFA) 108 (90  Base) MCG/ACT inhaler  - option to restart flovent and albuterol if needed with allergy season. Rtc precautions.   Meds ordered this encounter  Medications   atorvastatin (LIPITOR) 20 MG tablet    Sig: Take 1 tablet (20 mg total) by mouth daily.    Dispense:  90 tablet    Refill:  1   fluticasone (FLOVENT HFA) 110 MCG/ACT inhaler    Sig: Inhale 2 puffs into the lungs 2 (two) times daily.    Dispense:  12 g    Refill:  5   albuterol (VENTOLIN HFA) 108 (90 Base) MCG/ACT inhaler    Sig: INHALE 1 TO 2 PUFFS INTO THE LUNGS EVERY 6 HOURS AS NEEDED    Dispense:  18 g    Refill:  1   Patient Instructions  Restart allergy meds as needed to prevent upper airway symptoms during allergy season.  Restart flovent during allergy season, with albuterol if needed. If you require albuterol frequently let me know so we can change meds.   No change in meds for now.  Thanks for coming in today.      Signed,   [name], MD [facility] 12/09/20 12:02 PM

## 2020-12-09 NOTE — Patient Instructions (Addendum)
Restart allergy meds as needed to prevent upper airway symptoms during allergy season.  Restart flovent during allergy season, with albuterol if needed. If you require albuterol frequently let me know so we can change meds.   No change in meds for now.  Thanks for coming in today.

## 2020-12-12 ENCOUNTER — Encounter: Payer: Self-pay | Admitting: Family Medicine

## 2021-01-26 ENCOUNTER — Ambulatory Visit: Payer: 59 | Attending: Internal Medicine

## 2021-01-26 DIAGNOSIS — Z23 Encounter for immunization: Secondary | ICD-10-CM

## 2021-01-26 NOTE — Progress Notes (Signed)
   Covid-19 Vaccination Clinic  Name:  Blake Nash    MRN: [id number] DOB: 10/22/1955  01/26/2021  [name] was observed post Covid-19 immunization for 15 minutes without incident. He was provided with Vaccine Information Sheet and instruction to access the V-Safe system.   [name] was instructed to call 911 with any severe reactions post vaccine: Difficulty breathing  Swelling of face and throat  A fast heartbeat  A bad rash all over body  Dizziness and weakness

## 2021-02-01 ENCOUNTER — Other Ambulatory Visit (HOSPITAL_BASED_OUTPATIENT_CLINIC_OR_DEPARTMENT_OTHER): Payer: Self-pay

## 2021-02-01 MED ORDER — COVID-19MRNA BIVAL VACC PFIZER 30 MCG/0.3ML IM SUSP
INTRAMUSCULAR | 0 refills | Status: DC
  Filled 2021-02-01: qty 0.3, 1d supply, fill #0

## 2021-06-12 ENCOUNTER — Ambulatory Visit (INDEPENDENT_AMBULATORY_CARE_PROVIDER_SITE_OTHER): Payer: Medicare Other | Admitting: Family Medicine

## 2021-06-12 ENCOUNTER — Encounter: Payer: Self-pay | Admitting: Family Medicine

## 2021-06-12 ENCOUNTER — Encounter: Payer: 59 | Admitting: Family Medicine

## 2021-06-12 VITALS — BP 128/76 | HR 84 | Temp 98.3°F | Resp 17 | Ht 68.0 in | Wt 201.1 lb

## 2021-06-12 DIAGNOSIS — E785 Hyperlipidemia, unspecified: Secondary | ICD-10-CM | POA: Diagnosis not present

## 2021-06-12 DIAGNOSIS — Z130 Encounter for screening for diseases of the blood and blood-forming organs and certain disorders involving the immune mechanism: Secondary | ICD-10-CM | POA: Diagnosis not present

## 2021-06-12 DIAGNOSIS — Z136 Encounter for screening for cardiovascular disorders: Secondary | ICD-10-CM | POA: Diagnosis not present

## 2021-06-12 DIAGNOSIS — Z125 Encounter for screening for malignant neoplasm of prostate: Secondary | ICD-10-CM

## 2021-06-12 DIAGNOSIS — Z131 Encounter for screening for diabetes mellitus: Secondary | ICD-10-CM

## 2021-06-12 DIAGNOSIS — H9190 Unspecified hearing loss, unspecified ear: Secondary | ICD-10-CM | POA: Diagnosis not present

## 2021-06-12 DIAGNOSIS — Z0000 Encounter for general adult medical examination without abnormal findings: Secondary | ICD-10-CM

## 2021-06-12 DIAGNOSIS — S39012A Strain of muscle, fascia and tendon of lower back, initial encounter: Secondary | ICD-10-CM | POA: Diagnosis not present

## 2021-06-12 MED ORDER — CYCLOBENZAPRINE HCL 5 MG PO TABS: 5.0000 mg | ORAL_TABLET | Freq: Three times a day (TID) | ORAL | 1 refills | Status: DC | PRN

## 2021-06-12 MED ORDER — ATORVASTATIN CALCIUM 20 MG PO TABS: 20.0000 mg | ORAL_TABLET | Freq: Every day | ORAL | 1 refills | Status: DC

## 2021-06-12 NOTE — Progress Notes (Addendum)
Subjective:  Patient ID: Blake Nash, male    DOB: Aug 23, 1955  Age: 65 y.o. MRN: [id number]  CC:  Chief Complaint  Patient presents with   Annual Exam    Pt is concerned about back strain from a week ago, has been using ibuprofen and heat has gotten mostly better but reports a knot near his tail bone     HPI Blake Nash presents for  Annual wellness exam, back pain.   Back pain: Strain approximately 1 week ago - cutting wood, felt pain in lower R back with pulling up fence fabric.   Has treated at home with ibuprofen, and heat, massage - wife felt a bump R lower side. No skin changes.  No bowel or bladder incontinence, no saddle anesthesia, no lower extremity weakness. No fever, weight loss. No prior injection, surgery.  Getting better with ibuprofen 800mg . Some soreness at night. Would like to try mm.  relaxer.   Mild persistent asthma Treats with Flovent HFA, better during the summer.  Albuterol if needed, Zyrtec, Nasonex if needed in the fall only or with dust exposure.  Rare use albuterol with cold air.   Hyperlipidemia: Lipitor 20 mg daily. No new myalgias/side effects.  Lab Results  Component Value Date   CHOL 168 12/09/2020   HDL 52.70 12/09/2020   LDLCALC 101 (H) 12/09/2020   TRIG 73.0 12/09/2020   CHOLHDL 3 12/09/2020   Lab Results  Component Value Date   ALT 22 12/09/2020   AST 23 12/09/2020   ALKPHOS 63 12/09/2020   BILITOT 0.6 12/09/2020   Care team: PCP: me GI: Ardis Hughs - Colonoscopy, prior hx of polyps. Repeat in 5 yrs.    Fall screening Fall Risk  06/12/2021 12/09/2020 12/21/2019 08/14/2018 07/11/2017  Falls in the past year? 0 0 0 0 No  Number falls in past yr: 0 0 - 0 -  Injury with Fall? 0 0 - 0 -  Risk for fall due to : No Fall Risks No Fall Risks - - -  Follow up Falls evaluation completed Falls evaluation completed Falls evaluation completed - -   Lighting in home: adequate.  Loose rugs/carpets/pets: pets at home, few loose rugs.  Stairs: 1  flight with handrail.  Grab bars in bathroom: none. New shower with grab bar.  Timed up and go: 7 seconds, normal gait, no instability.   Depression Screening: Depression screen [facility] 2/9 12/09/2020 12/21/2019 08/14/2018 07/11/2017 04/23/2017  Decreased Interest 0 0 0 0 0  Down, Depressed, Hopeless 0 0 0 0 0  PHQ - 2 Score 0 0 0 0 0    Cancer Screening: Colonoscopy planned in 2026 (5 year repeat).  Lab Results  Component Value Date   PSA1 1.0 07/11/2017   PSA 0.8 12/15/2015  No FH of prostate CA.  The natural history of prostate cancer and ongoing controversy regarding screening and potential treatment outcomes of prostate cancer has been discussed with the patient. The meaning of a false positive PSA and a false negative PSA has been discussed. He indicates understanding of the limitations of this screening test and wishes to proceed with screening PSA testing. No hx of skin CA. No new moles.   Immunization History  Administered Date(s) Administered   Influenza Split 01/29/2012   Influenza,inj,Quad PF,6+ Mos 04/23/2017   Moderna SARS-COV2 Booster Vaccination 08/18/2020   Moderna Sars-Covid-2 Vaccination 07/29/2019, 08/26/2019   Pfizer Covid-19 Vaccine Bivalent Booster 23yrs & up 01/26/2021   Tdap 04/23/2017  Shingles vaccine in  2021 at pharmacy? Will consider at pharmacy - 2nd dose.   Functional Status Survey: Is the patient deaf or have difficulty hearing?: Yes (slightly decreased) Does the patient have difficulty seeing, even when wearing glasses/contacts?: No Does the patient have difficulty concentrating, remembering, or making decisions?: No Does the patient have difficulty walking or climbing stairs?: No (Hxt of fall) Does the patient have difficulty dressing or bathing?: No Does the patient have difficulty doing errands alone such as visiting a doctor's office or shopping?: No No hearing test - discussed options, hx of tinnitus. Hearing ok overall - certain tones affect him.    Memory Screen: 6CIT Screen 06/12/2021  What Year? 0 points  What month? 0 points  What time? 0 points  Count back from 20 0 points  Months in reverse 0 points  Repeat phrase 0 points  Total Score 0    Alcohol Screening: [place] Office Visit from 06/12/2021 in [place]  AUDIT-C Score 0      Tobacco: None.   No results found. 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   Dental: Every 24months.   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   Advanced Directives:  Has living will and HCPOA - no changes.     History Patient Active Problem List   Diagnosis Date Noted   Asthma 01/29/2012   Allergic rhinitis 01/29/2012   Past Medical History:  Diagnosis Date   Allergy    Asthma    Depression    minor- resolved per pt    GERD (gastroesophageal reflux disease)    occasionally with diet changes    History of blood transfusion    1990's   Hyperlipidemia    Past Surgical History:  Procedure Laterality Date   COLONOSCOPY     FRACTURE SURGERY     HERNIA REPAIR     PILONIDAL CYST EXCISION  1980   POLYPECTOMY     VASECTOMY     Allergies  Allergen Reactions   Gentamycin [Gentamicin] Other (See Comments)    Eye drop   Prior to Admission medications   Medication Sig Start Date End Date Taking? Authorizing Provider  albuterol (VENTOLIN HFA) 108 (90 Base) MCG/ACT inhaler INHALE 1 TO 2 PUFFS INTO THE LUNGS EVERY 6 HOURS AS NEEDED 12/09/20  Yes Wendie Agreste, MD  atorvastatin (LIPITOR) 20 MG tablet Take 1 tablet (20 mg total) by mouth daily. 12/09/20  Yes Wendie Agreste, MD  cetirizine (ZYRTEC) 10 MG tablet Take 10 mg by mouth as needed for allergies.   Yes [provider]  COVID-19 mRNA bivalent vaccine, Pfizer, injection Inject into the muscle. 01/26/21  Yes Carlyle Basques, MD  COVID-19 mRNA Vac-TriS, Pfizer, (PFIZER-BIONT COVID-19 VAC-TRIS) SUSP injection Inject into the muscle. 08/18/20   Yes Carlyle Basques, MD  cromolyn (OPTICROM) 4 % ophthalmic solution Place 1 drop into both eyes 4 (four) times daily.   Yes [provider]  fluticasone (FLOVENT HFA) 110 MCG/ACT inhaler Inhale 2 puffs into the lungs 2 (two) times daily. 12/09/20  Yes Wendie Agreste, MD  mometasone (NASONEX) 50 MCG/ACT nasal spray Place 2 sprays into the nose as needed.    Yes [provider]  Multiple Vitamin (MULTIVITAMIN) tablet Take 1 tablet by mouth daily. Over 50+ daily   Yes [provider]   Social History   Socioeconomic History   Marital status: Married    Spouse name: Not on file   Number of children:  Not on file   Years of education: Not on file   Highest education level: Not on file  Occupational History   Not on file  Tobacco Use   Smoking status: Former    Types: Cigarettes   Smokeless tobacco: Never   Tobacco comments:    quit at age 24  Substance and Sexual Activity   Alcohol use: Yes    Comment: occ   Drug use: No   Sexual activity: Not Currently  Other Topics Concern   Not on file  Social History Narrative   Marital status: married      Children: 2 children; +grandchildren 4      Lives:  With wife.      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  Training on the farm on weekends.   Social Determinants of Health   Financial Resource Strain: Not on file  Food Insecurity: Not on file  Transportation Needs: Not on file  Physical Activity: Not on file  Stress: Not on file  Social Connections: Not on file  Intimate Partner Violence: Not on file    Review of Systems 13 point review of systems per patient health survey noted.  Negative other than as indicated above or in HPI.    Objective:   Vitals:   06/12/21 1400  BP: 128/76  Pulse: 84  Resp: 17  Temp: 98.3 F (36.8 C)  TempSrc: Temporal  SpO2: 95%  Weight:  201 lb 1.6 oz (91.2 kg)  Height: 5\' 8"  (1.727 m)     Physical Exam Vitals reviewed.  Constitutional:      Appearance: He is well-developed.  HENT:     Head: Normocephalic and atraumatic.     Right Ear: External ear normal.     Left Ear: External ear normal.  Eyes:     Conjunctiva/sclera: Conjunctivae normal.     Pupils: Pupils are equal, round, and reactive to light.  Neck:     Thyroid: No thyromegaly.  Cardiovascular:     Rate and Rhythm: Normal rate and regular rhythm.     Heart sounds: Normal heart sounds.  Pulmonary:     Effort: Pulmonary effort is normal. No respiratory distress.     Breath sounds: Normal breath sounds. No wheezing.  Abdominal:     General: There is no distension.     Palpations: Abdomen is soft.     Tenderness: There is no abdominal tenderness.  Musculoskeletal:        General: No tenderness. Normal range of motion.     Cervical back: Normal range of motion and neck supple.  Lymphadenopathy:     Cervical: No cervical adenopathy.  Skin:    General: Skin is warm and dry.  Neurological:     Mental Status: He is alert and oriented to person, place, and time.     Deep Tendon Reflexes: Reflexes are normal and symmetric.  Psychiatric:        Behavior: Behavior normal.   EKG sinus rhythm, rate 66.  Slightly low voltage but does not appear significantly changed from his previous EKG in March 2019.  No acute ST or T wave changes or acute findings.  Assessment & Plan:  Blake Nash is a 66 y.o. male . Medicare annual wellness visit, initial  - - anticipatory guidance as  below in AVS, screening labs if needed. Health maintenance items as above in HPI discussed/recommended as applicable.  - no concerning responses on depression, fall, or functional status screening. Any positive responses noted as above. Advanced directives discussed as in CHL.   Hyperlipidemia, unspecified hyperlipidemia type - Plan: Comprehensive metabolic panel, Lipid panel,  atorvastatin (LIPITOR) 20 MG tablet  -  Stable, tolerating current regimen. Medications refilled. Labs pending as above.   Screening for deficiency anemia - Plan: Comprehensive metabolic panel, CBC with Differential/Platelet  Screening for diabetes mellitus - Plan: Comprehensive metabolic panel, Hemoglobin A1c  Screening for prostate cancer - Plan: PSA  Strain of lumbar region, initial encounter - Plan: cyclobenzaprine (FLEXERIL) 5 MG tablet  - no red flags on exam, deferred imaging at this time as improving. Flexeril if needed - side effects discussed with rtc precautions.   Hearing loss, unspecified hearing loss type, unspecified laterality - Plan: Ambulatory referral to Audiology  - selected frequencies by history. Refer for testing.   Screening for cardiovascular condition - Plan: EKG 12-Lead  - no acute/concerning findings on ekg.   Meds ordered this encounter  Medications   atorvastatin (LIPITOR) 20 MG tablet    Sig: Take 1 tablet (20 mg total) by mouth daily.    Dispense:  90 tablet    Refill:  1   cyclobenzaprine (FLEXERIL) 5 MG tablet    Sig: Take 1 tablet (5 mg total) by mouth 3 (three) times daily as needed for muscle spasms (start qhs prn due to sedation).    Dispense:  15 tablet    Refill:  1   Patient Instructions   Temporary ibuprofen if needed. Flexeril if needed short term. if back pain persists, return for recheck.   I will refer you for audiogram.   No med changes otherwise for now. Thanks for coming in today.   Preventive Care 3 Years and Older, Male Preventive care refers to lifestyle choices and visits with your health care provider that can promote health and wellness. Preventive care visits are also called wellness exams. What can I expect for my preventive care visit? Counseling During your preventive care visit, your health care provider may ask about your: Medical history, including: Past medical problems. Family medical history. History of  falls. Current health, including: Emotional well-being. Home life and relationship well-being. Sexual activity. Memory and ability to understand (cognition). Lifestyle, including: Alcohol, nicotine or tobacco, and drug use. Access to firearms. Diet, exercise, and sleep habits. Work and work Statistician. Sunscreen use. Safety issues such as seatbelt and bike helmet use. Physical exam Your health care provider will check your: Height and weight. These may be used to calculate your BMI (body mass index). BMI is a measurement that tells if you are at a healthy weight. Waist circumference. This measures the distance around your waistline. This measurement also tells if you are at a healthy weight and may help predict your risk of certain diseases, such as type 2 diabetes and high blood pressure. Heart rate and blood pressure. Body temperature. Skin for abnormal spots. What immunizations do I need? Vaccines are usually given at various ages, according to a schedule. Your health care provider will recommend vaccines for you based on your age, medical history, and lifestyle or other factors, such as travel or where you work. What tests do I need? Screening Your health care provider may recommend screening tests for certain conditions. This may include: Lipid and cholesterol levels. Diabetes screening. This is done by checking your  blood sugar (glucose) after you have not eaten for a while (fasting). Hepatitis C test. Hepatitis B test. HIV (human immunodeficiency virus) test. STI (sexually transmitted infection) testing, if you are at risk. Lung cancer screening. Colorectal cancer screening. Prostate cancer screening. Abdominal aortic aneurysm (AAA) screening. You may need this if you are a current or former smoker. Talk with your health care provider about your test results, treatment options, and if necessary, the need for more tests. Follow these instructions at home: Eating and  drinking  Eat a diet that includes fresh fruits and vegetables, whole grains, lean protein, and low-fat dairy products. Limit your intake of foods with high amounts of sugar, saturated fats, and salt. Take vitamin and mineral supplements as recommended by your health care provider. Do not drink alcohol if your health care provider tells you not to drink. If you drink alcohol: Limit how much you have to 0-2 drinks a day. Know how much alcohol is in your drink. In the [place] one drink equals one 12 oz bottle of beer (355 mL), one 5 oz glass of wine (148 mL), or one 1 oz glass of hard liquor (44 mL). Lifestyle Brush your teeth every morning and night with fluoride toothpaste. Nash one time each day. Exercise for at least 30 minutes 5 or more days each week. Do not use any products that contain nicotine or tobacco. These products include cigarettes, chewing tobacco, and vaping devices, such as e-cigarettes. If you need help quitting, ask your health care provider. Do not use drugs. If you are sexually active, practice safe sex. Use a condom or other form of protection to prevent STIs. Take aspirin only as told by your health care provider. Make sure that you understand how much to take and what form to take. Work with your health care provider to find out whether it is safe and beneficial for you to take aspirin daily. Ask your health care provider if you need to take a cholesterol-lowering medicine (statin). Find healthy ways to manage stress, such as: Meditation, yoga, or listening to music. Journaling. Talking to a trusted person. Spending time with friends and family. Safety Always wear your seat belt while driving or riding in a vehicle. Do not drive: If you have been drinking alcohol. Do not ride with someone who has been drinking. When you are tired or distracted. While texting. If you have been using any mind-altering substances or drugs. Wear a helmet and other protective equipment  during sports activities. If you have firearms in your house, make sure you follow all gun safety procedures. Minimize exposure to UV radiation to reduce your risk of skin cancer. What's next? Visit your health care provider once a year for an annual wellness visit. Ask your health care provider how often you should have your eyes and teeth checked. Stay up to date on all vaccines. This information is not intended to replace advice given to you by your health care provider. Make sure you discuss any questions you have with your health care provider. Document Revised: 10/19/2020 Document Reviewed: 10/19/2020 Elsevier Patient Education  2022 [place].   Lumbosacral Strain Lumbosacral strain is an injury that causes pain in the lower back (lumbosacral spine). This injury usually happens from overstretching the muscles or ligaments along your spine. Ligaments are cord-like tissues that connect bones to other bones. A strain can affect one or more muscles or ligaments. What are the causes? This condition may be caused by: A hard, direct hit to  the back. Overstretching the lower back muscles. This may result from: A fall. Lifting something heavy. Repetitive movements such as bending or crouching. What increases the risk? The following factors may make you more likely to develop this condition: Participating in sports or activities that involve: A sudden twist of the back. Pushing or pulling motions. Being overweight or obese. Having poor strength and flexibility, especially tight hamstrings or weak muscles in the back or abdomen. Having too much of a curve in the lower back. Having a pelvis that is tilted forward. What are the signs or symptoms? The main symptom of this condition is pain in the lower back, at the site of the strain. Pain may also be felt down one or both legs. How is this diagnosed? This condition is diagnosed based on your symptoms, your medical history, and a  physical exam. During the physical exam, your health care provider may push on certain areas of your back to find the source of your pain. You may be asked to bend forward, backward, and side to side to check your pain and range of motion. You may also have imaging tests, such as X-rays and an MRI. How is this treated? This condition may be treated by: Applying heat and cold on the affected area. Taking medicines to help relieve pain and relax your muscles. Taking NSAIDs, such as ibuprofen, to help reduce swelling and discomfort. Doing stretching and strengthening exercises for your lower back. Symptoms usually improve within several weeks of treatment. However, recovery time varies. When your symptoms improve, gradually return to your normal routine as soon as possible to reduce pain, avoid stiffness, and keep muscle strength. Follow these instructions at home: Medicines Take over-the-counter and prescription medicines only as told by your health care provider. Ask your health care provider if the medicine prescribed to you: Requires you to avoid driving or using heavy machinery. Can cause constipation. You may need to take these actions to prevent or treat constipation: Drink enough fluid to keep your urine pale yellow. Take over-the-counter or prescription medicines. Eat foods that are high in fiber, such as beans, whole grains, and fresh fruits and vegetables. Limit foods that are high in fat and processed sugars, such as fried or sweet foods. Managing pain, stiffness, and swelling   If directed, put ice on the injured area. To do this: Put ice in a plastic bag. Place a towel between your skin and the bag. Leave the ice on for 20 minutes, 2-3 times a day. If directed, apply heat on the affected area as often as told by your health care provider. Use the heat source that your health care provider recommends, such as a moist heat pack or a heating pad. Place a towel between your skin and  the heat source. Leave the heat on for 20-30 minutes. Remove the heat if your skin turns bright red. This is especially important if you are unable to feel pain, heat, or cold. You may have a greater risk of getting burned. Activity Rest as told by your health care provider. Do not stay in bed. Staying in bed for more than 1-2 days can delay your recovery. Return to your normal activities as told by your health care provider. Ask your health care provider what activities are safe for you. Avoid activities that take a lot of energy for as long as told by your health care provider. Do exercises as told by your health care provider. This includes stretching and strengthening exercises. General  instructions Sit up and stand up straight. Avoid leaning forward when you sit, or hunching over when you stand. Do not use any products that contain nicotine or tobacco, such as cigarettes, e-cigarettes, and chewing tobacco. If you need help quitting, ask your health care provider. Keep all follow-up visits as told by your health care provider. This is important. How is this prevented?  Use correct form when playing sports and lifting heavy objects. Use good posture when sitting and standing. Maintain a healthy weight. Sleep on a mattress with medium firmness to support your back. Do at least 150 minutes of moderate-intensity exercise each week, such as brisk walking or water aerobics. Try a form of exercise that takes stress off your back, such as swimming or stationary cycling. Maintain physical fitness, including: Strength. Flexibility. Contact a health care provider if: Your back pain does not improve after several weeks of treatment. Your symptoms get worse. Get help right away if: Your back pain is severe. You cannot stand or walk. You have difficulty controlling when you urinate or when you have a bowel movement. You feel nauseous or you vomit. Your feet or legs get very cold, turn pale, or  look blue. You have numbness, tingling, weakness, or problems using your arms or legs. You develop any of the following: Shortness of breath. Dizziness. Pain in your legs. Weakness in your buttocks or legs. Summary Lumbosacral strain is an injury that causes pain in the lower back (lumbosacral spine). This injury usually happens from overstretching the muscles or ligaments along your spine. This condition may be caused by a direct hit to the lower back or by overstretching the lower back muscles. Symptoms usually improve within several weeks of treatment. This information is not intended to replace advice given to you by your health care provider. Make sure you discuss any questions you have with your health care provider. Document Revised: 09/16/2018 Document Reviewed: 09/16/2018 Elsevier Patient Education  2022 [place],   [name], MD [place] 06/12/21 3:14 PM

## 2021-06-12 NOTE — Patient Instructions (Signed)
Temporary ibuprofen if needed. Flexeril if needed short term. if back pain persists, return for recheck.   I will refer you for audiogram.   No med changes otherwise for now. Thanks for coming in today.   Preventive Care 54 Years and Older, Male Preventive care refers to lifestyle choices and visits with your health care provider that can promote health and wellness. Preventive care visits are also called wellness exams. What can I expect for my preventive care visit? Counseling During your preventive care visit, your health care provider may ask about your: Medical history, including: Past medical problems. Family medical history. History of falls. Current health, including: Emotional well-being. Home life and relationship well-being. Sexual activity. Memory and ability to understand (cognition). Lifestyle, including: Alcohol, nicotine or tobacco, and drug use. Access to firearms. Diet, exercise, and sleep habits. Work and work Statistician. Sunscreen use. Safety issues such as seatbelt and bike helmet use. Physical exam Your health care provider will check your: Height and weight. These may be used to calculate your BMI (body mass index). BMI is a measurement that tells if you are at a healthy weight. Waist circumference. This measures the distance around your waistline. This measurement also tells if you are at a healthy weight and may help predict your risk of certain diseases, such as type 2 diabetes and high blood pressure. Heart rate and blood pressure. Body temperature. Skin for abnormal spots. What immunizations do I need? Vaccines are usually given at various ages, according to a schedule. Your health care provider will recommend vaccines for you based on your age, medical history, and lifestyle or other factors, such as travel or where you work. What tests do I need? Screening Your health care provider may recommend screening tests for certain conditions. This may  include: Lipid and cholesterol levels. Diabetes screening. This is done by checking your blood sugar (glucose) after you have not eaten for a while (fasting). Hepatitis C test. Hepatitis B test. HIV (human immunodeficiency virus) test. STI (sexually transmitted infection) testing, if you are at risk. Lung cancer screening. Colorectal cancer screening. Prostate cancer screening. Abdominal aortic aneurysm (AAA) screening. You may need this if you are a current or former smoker. Talk with your health care provider about your test results, treatment options, and if necessary, the need for more tests. Follow these instructions at home: Eating and drinking  Eat a diet that includes fresh fruits and vegetables, whole grains, lean protein, and low-fat dairy products. Limit your intake of foods with high amounts of sugar, saturated fats, and salt. Take vitamin and mineral supplements as recommended by your health care provider. Do not drink alcohol if your health care provider tells you not to drink. If you drink alcohol: Limit how much you have to 0-2 drinks a day. Know how much alcohol is in your drink. In the [place] one drink equals one 12 oz bottle of beer (355 mL), one 5 oz glass of wine (148 mL), or one 1 oz glass of hard liquor (44 mL). Lifestyle Brush your teeth every morning and night with fluoride toothpaste. Floss one time each day. Exercise for at least 30 minutes 5 or more days each week. Do not use any products that contain nicotine or tobacco. These products include cigarettes, chewing tobacco, and vaping devices, such as e-cigarettes. If you need help quitting, ask your health care provider. Do not use drugs. If you are sexually active, practice safe sex. Use a condom or other form of protection to prevent  STIs. Take aspirin only as told by your health care provider. Make sure that you understand how much to take and what form to take. Work with your health care provider to find out  whether it is safe and beneficial for you to take aspirin daily. Ask your health care provider if you need to take a cholesterol-lowering medicine (statin). Find healthy ways to manage stress, such as: Meditation, yoga, or listening to music. Journaling. Talking to a trusted person. Spending time with friends and family. Safety Always wear your seat belt while driving or riding in a vehicle. Do not drive: If you have been drinking alcohol. Do not ride with someone who has been drinking. When you are tired or distracted. While texting. If you have been using any mind-altering substances or drugs. Wear a helmet and other protective equipment during sports activities. If you have firearms in your house, make sure you follow all gun safety procedures. Minimize exposure to UV radiation to reduce your risk of skin cancer. What's next? Visit your health care provider once a year for an annual wellness visit. Ask your health care provider how often you should have your eyes and teeth checked. Stay up to date on all vaccines. This information is not intended to replace advice given to you by your health care provider. Make sure you discuss any questions you have with your health care provider. Document Revised: 10/19/2020 Document Reviewed: 10/19/2020 Elsevier Patient Education  2022 [employer].   Lumbosacral Strain Lumbosacral strain is an injury that causes pain in the lower back (lumbosacral spine). This injury usually happens from overstretching the muscles or ligaments along your spine. Ligaments are cord-like tissues that connect bones to other bones. A strain can affect one or more muscles or ligaments. What are the causes? This condition may be caused by: A hard, direct hit to the back. Overstretching the lower back muscles. This may result from: A fall. Lifting something heavy. Repetitive movements such as bending or crouching. What increases the risk? The following factors may  make you more likely to develop this condition: Participating in sports or activities that involve: A sudden twist of the back. Pushing or pulling motions. Being overweight or obese. Having poor strength and flexibility, especially tight hamstrings or weak muscles in the back or abdomen. Having too much of a curve in the lower back. Having a pelvis that is tilted forward. What are the signs or symptoms? The main symptom of this condition is pain in the lower back, at the site of the strain. Pain may also be felt down one or both legs. How is this diagnosed? This condition is diagnosed based on your symptoms, your medical history, and a physical exam. During the physical exam, your health care provider may push on certain areas of your back to find the source of your pain. You may be asked to bend forward, backward, and side to side to check your pain and range of motion. You may also have imaging tests, such as X-rays and an MRI. How is this treated? This condition may be treated by: Applying heat and cold on the affected area. Taking medicines to help relieve pain and relax your muscles. Taking NSAIDs, such as ibuprofen, to help reduce swelling and discomfort. Doing stretching and strengthening exercises for your lower back. Symptoms usually improve within several weeks of treatment. However, recovery time varies. When your symptoms improve, gradually return to your normal routine as soon as possible to reduce pain, avoid stiffness, and  keep muscle strength. Follow these instructions at home: Medicines Take over-the-counter and prescription medicines only as told by your health care provider. Ask your health care provider if the medicine prescribed to you: Requires you to avoid driving or using heavy machinery. Can cause constipation. You may need to take these actions to prevent or treat constipation: Drink enough fluid to keep your urine pale yellow. Take over-the-counter or  prescription medicines. Eat foods that are high in fiber, such as beans, whole grains, and fresh fruits and vegetables. Limit foods that are high in fat and processed sugars, such as fried or sweet foods. Managing pain, stiffness, and swelling   If directed, put ice on the injured area. To do this: Put ice in a plastic bag. Place a towel between your skin and the bag. Leave the ice on for 20 minutes, 2-3 times a day. If directed, apply heat on the affected area as often as told by your health care provider. Use the heat source that your health care provider recommends, such as a moist heat pack or a heating pad. Place a towel between your skin and the heat source. Leave the heat on for 20-30 minutes. Remove the heat if your skin turns bright red. This is especially important if you are unable to feel pain, heat, or cold. You may have a greater risk of getting burned. Activity Rest as told by your health care provider. Do not stay in bed. Staying in bed for more than 1-2 days can delay your recovery. Return to your normal activities as told by your health care provider. Ask your health care provider what activities are safe for you. Avoid activities that take a lot of energy for as long as told by your health care provider. Do exercises as told by your health care provider. This includes stretching and strengthening exercises. General instructions Sit up and stand up straight. Avoid leaning forward when you sit, or hunching over when you stand. Do not use any products that contain nicotine or tobacco, such as cigarettes, e-cigarettes, and chewing tobacco. If you need help quitting, ask your health care provider. Keep all follow-up visits as told by your health care provider. This is important. How is this prevented?  Use correct form when playing sports and lifting heavy objects. Use good posture when sitting and standing. Maintain a healthy weight. Sleep on a mattress with medium firmness  to support your back. Do at least 150 minutes of moderate-intensity exercise each week, such as brisk walking or water aerobics. Try a form of exercise that takes stress off your back, such as swimming or stationary cycling. Maintain physical fitness, including: Strength. Flexibility. Contact a health care provider if: Your back pain does not improve after several weeks of treatment. Your symptoms get worse. Get help right away if: Your back pain is severe. You cannot stand or walk. You have difficulty controlling when you urinate or when you have a bowel movement. You feel nauseous or you vomit. Your feet or legs get very cold, turn pale, or look blue. You have numbness, tingling, weakness, or problems using your arms or legs. You develop any of the following: Shortness of breath. Dizziness. Pain in your legs. Weakness in your buttocks or legs. Summary Lumbosacral strain is an injury that causes pain in the lower back (lumbosacral spine). This injury usually happens from overstretching the muscles or ligaments along your spine. This condition may be caused by a direct hit to the lower back or by overstretching  the lower back muscles. Symptoms usually improve within several weeks of treatment. This information is not intended to replace advice given to you by your health care provider. Make sure you discuss any questions you have with your health care provider. Document Revised: 09/16/2018 Document Reviewed: 09/16/2018 Elsevier Patient Education  [employer].

## 2021-06-13 LAB — CBC WITH DIFFERENTIAL/PLATELET
Basophils Absolute: 0 10*3/uL (ref 0.0–0.1)
Basophils Relative: 1.2 % (ref 0.0–3.0)
Eosinophils Absolute: 0.3 10*3/uL (ref 0.0–0.7)
Eosinophils Relative: 7.7 % — ABNORMAL HIGH (ref 0.0–5.0)
HCT: 39.6 % (ref 39.0–52.0)
Hemoglobin: 13.2 g/dL (ref 13.0–17.0)
Lymphocytes Relative: 50.4 % — ABNORMAL HIGH (ref 12.0–46.0)
Lymphs Abs: 1.8 10*3/uL (ref 0.7–4.0)
MCHC: 33.3 g/dL (ref 30.0–36.0)
MCV: 92.5 fl (ref 78.0–100.0)
Monocytes Absolute: 0.4 10*3/uL (ref 0.1–1.0)
Monocytes Relative: 11.1 % (ref 3.0–12.0)
Neutro Abs: 1.1 10*3/uL — ABNORMAL LOW (ref 1.4–7.7)
Neutrophils Relative %: 29.6 % — ABNORMAL LOW (ref 43.0–77.0)
Platelets: 312 10*3/uL (ref 150.0–400.0)
RBC: 4.28 Mil/uL (ref 4.22–5.81)
RDW: 12.8 % (ref 11.5–15.5)
WBC: 3.6 10*3/uL — ABNORMAL LOW (ref 4.0–10.5)

## 2021-06-13 LAB — LIPID PANEL
Cholesterol: 160 mg/dL (ref 0–200)
HDL: 49.7 mg/dL (ref >39.00)
LDL Cholesterol: 90 mg/dL (ref 0–99)
NonHDL: 110.2
Total CHOL/HDL Ratio: 3
Triglycerides: 102 mg/dL (ref 0.0–149.0)
VLDL: 20.4 mg/dL (ref 0.0–40.0)

## 2021-06-13 LAB — COMPREHENSIVE METABOLIC PANEL
ALT: 21 U/L (ref 0–53)
AST: 22 U/L (ref 0–37)
Albumin: 4.2 g/dL (ref 3.5–5.2)
Alkaline Phosphatase: 65 U/L (ref 39–117)
BUN: 22 mg/dL (ref 6–23)
CO2: 33 mEq/L — ABNORMAL HIGH (ref 19–32)
Calcium: 9.5 mg/dL (ref 8.4–10.5)
Chloride: 101 mEq/L (ref 96–112)
Creatinine, Ser: 1.13 mg/dL (ref 0.40–1.50)
GFR: 68.33 mL/min (ref >60.00)
Glucose, Bld: 81 mg/dL (ref 70–99)
Potassium: 3.8 mEq/L (ref 3.5–5.1)
Sodium: 138 mEq/L (ref 135–145)
Total Bilirubin: 0.4 mg/dL (ref 0.2–1.2)
Total Protein: 6.6 g/dL (ref 6.0–8.3)

## 2021-06-13 LAB — PSA: PSA: 1.4 ng/mL (ref 0.10–4.00)

## 2021-06-13 LAB — HEMOGLOBIN A1C: Hgb A1c MFr Bld: 6.2 % (ref 4.6–6.5)

## 2021-08-17 DIAGNOSIS — Z20822 Contact with and (suspected) exposure to covid-19: Secondary | ICD-10-CM | POA: Diagnosis not present

## 2021-11-16 ENCOUNTER — Ambulatory Visit (INDEPENDENT_AMBULATORY_CARE_PROVIDER_SITE_OTHER): Payer: Medicare Other | Admitting: Family Medicine

## 2021-11-16 VITALS — BP 116/76 | HR 78 | Temp 98.5°F | Resp 16 | Ht 68.0 in | Wt 195.0 lb

## 2021-11-16 DIAGNOSIS — Z8249 Family history of ischemic heart disease and other diseases of the circulatory system: Secondary | ICD-10-CM | POA: Diagnosis not present

## 2021-11-16 DIAGNOSIS — E785 Hyperlipidemia, unspecified: Secondary | ICD-10-CM

## 2021-11-16 DIAGNOSIS — Z23 Encounter for immunization: Secondary | ICD-10-CM

## 2021-11-16 DIAGNOSIS — R0609 Other forms of dyspnea: Secondary | ICD-10-CM | POA: Diagnosis not present

## 2021-11-16 DIAGNOSIS — R635 Abnormal weight gain: Secondary | ICD-10-CM

## 2021-11-16 DIAGNOSIS — J4530 Mild persistent asthma, uncomplicated: Secondary | ICD-10-CM | POA: Diagnosis not present

## 2021-11-16 DIAGNOSIS — R002 Palpitations: Secondary | ICD-10-CM | POA: Diagnosis not present

## 2021-11-16 LAB — COMPREHENSIVE METABOLIC PANEL
ALT: 21 U/L (ref 0–53)
AST: 24 U/L (ref 0–37)
Albumin: 4.6 g/dL (ref 3.5–5.2)
Alkaline Phosphatase: 70 U/L (ref 39–117)
BUN: 24 mg/dL — ABNORMAL HIGH (ref 6–23)
CO2: 25 mEq/L (ref 19–32)
Calcium: 9.5 mg/dL (ref 8.4–10.5)
Chloride: 104 mEq/L (ref 96–112)
Creatinine, Ser: 1.09 mg/dL (ref 0.40–1.50)
GFR: 71.13 mL/min (ref >60.00)
Glucose, Bld: 76 mg/dL (ref 70–99)
Potassium: 4.3 mEq/L (ref 3.5–5.1)
Sodium: 137 mEq/L (ref 135–145)
Total Bilirubin: 0.8 mg/dL (ref 0.2–1.2)
Total Protein: 7.6 g/dL (ref 6.0–8.3)

## 2021-11-16 LAB — CBC
HCT: 40.7 % (ref 39.0–52.0)
Hemoglobin: 13.5 g/dL (ref 13.0–17.0)
MCHC: 33.1 g/dL (ref 30.0–36.0)
MCV: 93.2 fl (ref 78.0–100.0)
Platelets: 284 10*3/uL (ref 150.0–400.0)
RBC: 4.37 Mil/uL (ref 4.22–5.81)
RDW: 14 % (ref 11.5–15.5)
WBC: 5.2 10*3/uL (ref 4.0–10.5)

## 2021-11-16 LAB — LIPID PANEL
Cholesterol: 168 mg/dL (ref 0–200)
HDL: 63.4 mg/dL (ref >39.00)
LDL Cholesterol: 94 mg/dL (ref 0–99)
NonHDL: 104.26
Total CHOL/HDL Ratio: 3
Triglycerides: 49 mg/dL (ref 0.0–149.0)
VLDL: 9.8 mg/dL (ref 0.0–40.0)

## 2021-11-16 LAB — TSH: TSH: 1.98 u[IU]/mL (ref 0.35–5.50)

## 2021-11-16 LAB — BRAIN NATRIURETIC PEPTIDE: Pro B Natriuretic peptide (BNP): 53 pg/mL (ref 0.0–100.0)

## 2021-11-16 MED ORDER — ATORVASTATIN CALCIUM 20 MG PO TABS: 20.0000 mg | ORAL_TABLET | Freq: Every day | ORAL | 1 refills | Status: DC

## 2021-11-16 MED ORDER — FLUTICASONE-SALMETEROL 250-50 MCG/ACT IN AEPB: 1.0000 | INHALATION_SPRAY | Freq: Two times a day (BID) | RESPIRATORY_TRACT | 5 refills | Status: DC

## 2021-11-16 MED ORDER — FLUTICASONE PROPIONATE HFA 110 MCG/ACT IN AERO: 2.0000 | INHALATION_SPRAY | Freq: Two times a day (BID) | RESPIRATORY_TRACT | 5 refills | Status: DC

## 2021-11-16 MED ORDER — ALBUTEROL SULFATE HFA 108 (90 BASE) MCG/ACT IN AERS: INHALATION_SPRAY | RESPIRATORY_TRACT | 1 refills | Status: DC

## 2021-11-16 NOTE — Patient Instructions (Addendum)
Try changing the flovent to Advair twice per day. If palpitations increase, we may need to return to flovent - let me know. Albuterol if needed.  I will check some labs, but will refer you to cardiology as well.  Let me know if there are questions.  Thanks for coming in today.  Return to the clinic or go to the nearest emergency room if any of your symptoms worsen or new symptoms occur.   Palpitations Palpitations are feelings that your heartbeat is irregular or is faster than normal. It may feel like your heart is fluttering or skipping a beat. Palpitations may be caused by many things, including smoking, caffeine, alcohol, stress, and certain medicines or drugs. Most causes of palpitations are not serious.  However, some palpitations can be a sign of a serious problem. Further tests and a thorough medical history will be done to find the cause of your palpitations. Your provider may order tests such as an ECG, labs, an echocardiogram, or an ambulatory continuous ECG monitor. Follow these instructions at home: Pay attention to any changes in your symptoms. Let your health care provider know about them. Take these actions to help manage your symptoms: Eating and drinking Follow instructions from your health care provider about eating or drinking restrictions. You may need to avoid foods and drinks that may cause palpitations. These may include: Caffeinated coffee, tea, soft drinks, and energy drinks. Chocolate. Alcohol. Diet pills. Lifestyle     Take steps to reduce your stress and anxiety. Things that can help you relax include: Yoga. Mind-body activities, such as deep breathing, meditation, or using words and images to create positive thoughts (guided imagery). Physical activity, such as swimming, jogging, or walking. Tell your health care provider if your palpitations increase with activity. If you have chest pain or shortness of breath with activity, do not continue the activity until you  are seen by your health care provider. Biofeedback. This is a method that helps you learn to use your mind to control things in your body, such as your heartbeat. Get plenty of rest and sleep. Keep a regular bed time. Do not use drugs, including cocaine or ecstasy. Do not use marijuana. Do not use any products that contain nicotine or tobacco. These products include cigarettes, chewing tobacco, and vaping devices, such as e-cigarettes. If you need help quitting, ask your health care provider. General instructions Take over-the-counter and prescription medicines only as told by your health care provider. Keep all follow-up visits. This is important. These may include visits for further testing if palpitations do not go away or get worse. Contact a health care provider if: You continue to have a fast or irregular heartbeat for a long period of time. You notice that your palpitations occur more often. Get help right away if: You have chest pain or shortness of breath. You have a severe headache. You feel dizzy or you faint. These symptoms may represent a serious problem that is an emergency. Do not wait to see if the symptoms will go away. Get medical help right away. Call your local emergency services (911 in the [place] Do not drive yourself to the hospital. Summary Palpitations are feelings that your heartbeat is irregular or is faster than normal. It may feel like your heart is fluttering or skipping a beat. Palpitations may be caused by many things, including smoking, caffeine, alcohol, stress, certain medicines, and drugs. Further tests and a thorough medical history may be done to find the cause of your  palpitations. Get help right away if you faint or have chest pain, shortness of breath, severe headache, or dizziness. This information is not intended to replace advice given to you by your health care provider. Make sure you discuss any questions you have with your health care  provider. Document Revised: 09/14/2020 Document Reviewed: 09/14/2020 Elsevier Patient Education  [employer].

## 2021-11-16 NOTE — Progress Notes (Signed)
Subjective:  Patient ID: Blake Nash, male    DOB: 12/18/1955  Age: 66 y.o. MRN: [id number]  CC:  Chief Complaint  Patient presents with   Asthma    Pt has had inhalers for a long time and notes some sensation of heart racing feeling despite HR being normal  Stopped caffine intake and sxs went away, notes it has happened in his sleep    Weight Gain    Pt has been gaining weight steadily for some time    Immunizations    Discuss pneumonia vaccine    Hyperlipidemia    HPI Blake Nash presents for   Palpitations New concern Feels sensation that heart is pumping more forceful, on occasion past 6 months, last month more often. In evenings. Not racing. No associated diaphoresis, n/v, chest pain, neck or jaw pain. No syncope/near-syncope sx's typically. One day a week ago felt a little lightheaded one morning, felt off - passed in few hours.  Feeling sluggish, Tired past year.   History of asthma, treated with Flovent, albuterol as needed in the past, and Zyrtec, Nasonex for allergies. Albuterol 2 times per week with air quality issues recently with smoke from [place]. flovent 2 puffs BID. 1 time in past week. No change in symptoms with albuterol. Feels out of breath, restricted breathing - short of breath walking distances past 6 months.   Did notice some improvement with decreased caffeine intake (1 cup from 2 cups coffee).    Father with CAD, CABG x3 in 2's.   Weight gain New concern.  Home weights in 190's. Just feels like he is carrying some extra weight. Past year seems to be carrying more weight.   No results found for: "TSH" Wt Readings from Last 3 Encounters:  11/16/21 195 lb (88.5 kg)  06/12/21 201 lb 1.6 oz (91.2 kg)  12/09/20 184 lb 12.8 oz (83.8 kg)   Hyperlipidemia: Lipitor 20 mg daily Lab Results  Component Value Date   CHOL 160 06/12/2021   HDL 49.70 06/12/2021   LDLCALC 90 06/12/2021   TRIG 102.0 06/12/2021   CHOLHDL 3 06/12/2021   Lab Results   Component Value Date   ALT 21 06/12/2021   AST 22 06/12/2021   ALKPHOS 65 06/12/2021   BILITOT 0.4 06/12/2021    Health maintenance Due for Pneumonia vaccination. Immunization History  Administered Date(s) Administered   Influenza Split 01/29/2012   Influenza,inj,Quad PF,6+ Mos 04/23/2017   Moderna SARS-COV2 Booster Vaccination 08/18/2020   Moderna Sars-Covid-2 Vaccination 07/29/2019, 08/26/2019   Pfizer Covid-19 Vaccine Bivalent Booster 35yr & up 01/26/2021   Tdap 04/23/2017    History Patient Active Problem List   Diagnosis Date Noted   Asthma 01/29/2012   Allergic rhinitis 01/29/2012   Past Medical History:  Diagnosis Date   Allergy    Asthma    Depression    minor- resolved per pt    GERD (gastroesophageal reflux disease)    occasionally with diet changes    History of blood transfusion    1990's   Hyperlipidemia    Past Surgical History:  Procedure Laterality Date   COLONOSCOPY     FRACTURE SURGERY     HERNIA REPAIR     PILONIDAL CYST EXCISION  1980   POLYPECTOMY     VASECTOMY     Allergies  Allergen Reactions   Gentamycin [Gentamicin] Other (See Comments)    Eye drop   Prior to Admission medications   Medication Sig Start Date End Date  Taking? Authorizing Provider  albuterol (VENTOLIN HFA) 108 (90 Base) MCG/ACT inhaler INHALE 1 TO 2 PUFFS INTO THE LUNGS EVERY 6 HOURS AS NEEDED 12/09/20  Yes Wendie Agreste, MD  atorvastatin (LIPITOR) 20 MG tablet Take 1 tablet (20 mg total) by mouth daily. 06/12/21  Yes Wendie Agreste, MD  cromolyn (OPTICROM) 4 % ophthalmic solution Place 1 drop into both eyes 4 (four) times daily.   Yes [provider]  fluticasone (FLOVENT HFA) 110 MCG/ACT inhaler Inhale 2 puffs into the lungs 2 (two) times daily. 12/09/20  Yes Wendie Agreste, MD  Multiple Vitamin (MULTIVITAMIN) tablet Take 1 tablet by mouth daily. Over 50+ daily   Yes [provider]   Social History   Socioeconomic History   Marital  status: Married    Spouse name: Not on file   Number of children: Not on file   Years of education: Not on file   Highest education level: Not on file  Occupational History   Not on file  Tobacco Use   Smoking status: Former    Types: Cigarettes   Smokeless tobacco: Never   Tobacco comments:    quit at age 22  Substance and Sexual Activity   Alcohol use: Yes    Comment: occ   Drug use: No   Sexual activity: Not Currently  Other Topics Concern   Not on file  Social History Narrative   Marital status: married      Children: 2 children; +grandchildren 4      Lives:  With wife.      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   Social Determinants of Health   Financial Resource Strain: Not on file  Food Insecurity: Not on file  Transportation Needs: Not on file  Physical Activity: Not on file  Stress: Not on file  Social Connections: Not on file  Intimate Partner Violence: Not on file    Review of Systems Per HPI  Objective:   Vitals:   11/16/21 0912  BP: 116/76  Pulse: 78  Resp: 16  Temp: 98.5 F (36.9 C)  TempSrc: Oral  SpO2: 96%  Weight: 195 lb (88.5 kg)  Height: '5\' 8"'$  (1.727 m)     Physical Exam Vitals reviewed.  Constitutional:      Appearance: He is well-developed.  HENT:     Head: Normocephalic and atraumatic.  Neck:     Vascular: No carotid bruit or JVD.  Cardiovascular:     Rate and Rhythm: Normal rate and regular rhythm.     Heart sounds: Normal heart sounds. No murmur heard.    Comments: Normal rate, rhythm, no ectopy, no murmur appreciated. Pulmonary:     Effort: Pulmonary effort is normal.     Breath sounds: Normal breath sounds. No stridor. No wheezing, rhonchi or rales.     Comments: Lungs clear Musculoskeletal:     Right lower leg: No edema.     Left lower leg: No edema.   Skin:    General: Skin is warm and dry.  Neurological:     General: No focal deficit present.     Mental Status: He is alert and oriented to person, place, and time.  Psychiatric:        Mood and Affect:  Mood normal.     EKG, sinus rhythm, rate 70, no apparent acute ST or T wave changes.  Compared to 06/12/2021, no significant changes.   Assessment & Plan:  YANKY VANDERBURG is a 66 y.o. male . Palpitations - Plan: EKG 12-Lead, CBC, TSH DOE (dyspnea on exertion) - Plan: Brain natriuretic peptide, TSH Family history of early CAD  -Episodic palpitations as above, notes some dyspnea on exertion, question deconditioning versus decreased asthma control, especially with recent air quality issues.  However with his family history of early CAD, risk factors of age and hyperlipidemia, refer to cardiology.  Check TSH, BNP but no signs of fluid overload exam.  Check CBC.  -Asthma med adjustment as below.  ER/RTC precautions given.  Mild persistent asthma without complication - Plan: albuterol (VENTOLIN HFA) 108 (90 Base) MCG/ACT inhaler, fluticasone (FLOVENT HFA) 110 MCG/ACT inhaler  -Decreased control, trial of Advair for ICS/LABA, but cautioned on increased palpitations with the LABA, and if that occurs return to Flovent.  Albuterol as needed.  Hyperlipidemia, unspecified hyperlipidemia type - Plan: atorvastatin (LIPITOR) 20 MG tablet, Comprehensive metabolic panel, Lipid panel  -Tolerating current dose, check labs.  Continue Lipitor 20 mg daily  Need for pneumococcal vaccination - Plan: Pneumococcal conjugate vaccine 20-valent (Prevnar 20)  -Prevnar given  Weight gain  -Weight lower on in-office reading versus last visit.  No sign of fluid overload.  Check TSH as above.  Continue to monitor.  Meds ordered this encounter  Medications   albuterol (VENTOLIN HFA) 108 (90 Base) MCG/ACT inhaler    Sig: INHALE 1 TO 2 PUFFS INTO THE LUNGS EVERY 6 HOURS AS NEEDED    Dispense:  18 g    Refill:  1    atorvastatin (LIPITOR) 20 MG tablet    Sig: Take 1 tablet (20 mg total) by mouth daily.    Dispense:  90 tablet    Refill:  1   fluticasone (FLOVENT HFA) 110 MCG/ACT inhaler    Sig: Inhale 2 puffs into the lungs 2 (two) times daily.    Dispense:  12 g    Refill:  5   Patient Instructions  Try changing the flovent to Advair twice per day. If palpitations increase, we may need to return to flovent - let me know. Albuterol if needed.  I will check some labs, but will refer you to cardiology as well.  Let me know if there are questions.  Thanks for coming in today.  Return to the clinic or go to the nearest emergency room if any of your symptoms worsen or new symptoms occur.   Palpitations Palpitations are feelings that your heartbeat is irregular or is faster than normal. It may feel like your heart is fluttering or skipping a beat. Palpitations may be caused by many things, including smoking, caffeine, alcohol, stress, and certain medicines or drugs. Most causes of palpitations are not serious.  However, some palpitations can be a sign of a serious problem. Further tests and a thorough medical history will be done to find the cause of your palpitations. Your provider may order tests such as an ECG, labs, an echocardiogram, or an ambulatory continuous ECG monitor. Follow these instructions at home: Pay attention to any changes in your symptoms. Let your health care provider know about them. Take these actions to help manage your symptoms: Eating and drinking Follow instructions from your health care provider about eating or drinking restrictions. You may need to avoid foods and drinks that may cause palpitations.  These may include: Caffeinated coffee, tea, soft drinks, and energy drinks. Chocolate. Alcohol. Diet pills. Lifestyle     Take steps to reduce your stress and anxiety. Things that can help you relax include: Yoga. Mind-body activities, such as deep breathing, meditation, or  using words and images to create positive thoughts (guided imagery). Physical activity, such as swimming, jogging, or walking. Tell your health care provider if your palpitations increase with activity. If you have chest pain or shortness of breath with activity, do not continue the activity until you are seen by your health care provider. Biofeedback. This is a method that helps you learn to use your mind to control things in your body, such as your heartbeat. Get plenty of rest and sleep. Keep a regular bed time. Do not use drugs, including cocaine or ecstasy. Do not use marijuana. Do not use any products that contain nicotine or tobacco. These products include cigarettes, chewing tobacco, and vaping devices, such as e-cigarettes. If you need help quitting, ask your health care provider. General instructions Take over-the-counter and prescription medicines only as told by your health care provider. Keep all follow-up visits. This is important. These may include visits for further testing if palpitations do not go away or get worse. Contact a health care provider if: You continue to have a fast or irregular heartbeat for a long period of time. You notice that your palpitations occur more often. Get help right away if: You have chest pain or shortness of breath. You have a severe headache. You feel dizzy or you faint. These symptoms may represent a serious problem that is an emergency. Do not wait to see if the symptoms will go away. Get medical help right away. Call your local emergency services (911 in the [place] Do not drive yourself to the hospital. Summary Palpitations are feelings that your heartbeat is irregular or is faster than normal. It may feel like your heart is fluttering or skipping a beat. Palpitations may be caused by many things, including smoking, caffeine, alcohol, stress, certain medicines, and drugs. Further tests and a thorough medical history may be done to find the cause  of your palpitations. Get help right away if you faint or have chest pain, shortness of breath, severe headache, or dizziness. This information is not intended to replace advice given to you by your health care provider. Make sure you discuss any questions you have with your health care provider. Document Revised: 09/14/2020 Document Reviewed: 09/14/2020 Elsevier Patient Education  [place],   [name], MD [facility] 11/16/21 10:23 AM

## 2021-11-29 ENCOUNTER — Ambulatory Visit: Payer: Medicare Other | Admitting: Family Medicine

## 2021-12-11 ENCOUNTER — Ambulatory Visit (INDEPENDENT_AMBULATORY_CARE_PROVIDER_SITE_OTHER): Payer: Medicare Other | Admitting: Family Medicine

## 2021-12-11 ENCOUNTER — Encounter: Payer: Medicare Other | Admitting: Family Medicine

## 2021-12-11 VITALS — BP 128/76 | HR 63 | Temp 98.0°F | Ht 68.0 in | Wt 199.6 lb

## 2021-12-11 DIAGNOSIS — R002 Palpitations: Secondary | ICD-10-CM | POA: Diagnosis not present

## 2021-12-11 DIAGNOSIS — J4530 Mild persistent asthma, uncomplicated: Secondary | ICD-10-CM

## 2021-12-11 DIAGNOSIS — Z8249 Family history of ischemic heart disease and other diseases of the circulatory system: Secondary | ICD-10-CM

## 2021-12-11 DIAGNOSIS — E785 Hyperlipidemia, unspecified: Secondary | ICD-10-CM | POA: Diagnosis not present

## 2021-12-11 NOTE — Patient Instructions (Addendum)
Glad to hear you are doing better, but I will refer you to cardiology to discuss the palpitations and further workup. Return to the clinic or go to the nearest emergency room if any of your symptoms worsen or new symptoms occur.  No change in Advair or other meds at this time.

## 2021-12-11 NOTE — Progress Notes (Signed)
Subjective:  Blake ID: Blake Nash, male    DOB: Jul 11, 1955  Age: 66 y.o. MRN: [id number]  CC:  Chief Complaint  Blake Nash with   Hyperlipidemia    HPI Blake Nash for   Hyperlipidemia: Lipitor '20mg'$  qd.  Lab Results  Component Value Date   CHOL 168 11/16/2021   HDL 63.40 11/16/2021   LDLCALC 94 11/16/2021   TRIG 49.0 11/16/2021   CHOLHDL 3 11/16/2021   Lab Results  Component Value Date   ALT 21 11/16/2021   AST 24 11/16/2021   ALKPHOS 70 11/16/2021   BILITOT 0.8 11/16/2021   Asthma: Mild persistent.  Decreased control at his July 13 visit.  Trial of Advair but cautioned about palpitations with the LABA.  Option return to [employer].  Albuterol as needed. Feeling better on advair. Only needed albuterol once.   Palpitations: Discussed at his July 13 visit.  TSH, BMP, CBC without concerns. Noticed palpitaitons one evening since last visit - lasted 30 seconds, then resolved. Less DOE. New inhaler has helped breathing.  FH of MI in father at 17yo.   History Blake Active Problem List   Diagnosis Date Noted   Asthma 01/29/2012   Allergic rhinitis 01/29/2012   Past Medical History:  Diagnosis Date   Allergy    Asthma    Depression    minor- resolved per pt    GERD (gastroesophageal reflux disease)    occasionally with diet changes    History of blood transfusion    1990's   Hyperlipidemia    Past Surgical History:  Procedure Laterality Date   COLONOSCOPY     FRACTURE SURGERY     HERNIA REPAIR     PILONIDAL CYST EXCISION  1980   POLYPECTOMY     VASECTOMY     Allergies  Allergen Reactions   Gentamycin [Gentamicin] Other (See Comments)    Eye drop   Prior to Admission medications   Medication Sig Start Date End Date Taking? Authorizing Provider  albuterol (VENTOLIN HFA) 108 (90 Base) MCG/ACT inhaler INHALE 1 TO 2 PUFFS INTO THE LUNGS EVERY 6 HOURS AS NEEDED 11/16/21  Yes Wendie Agreste, MD  atorvastatin (LIPITOR) 20 MG tablet  Take 1 tablet (20 mg total) by mouth daily. 11/16/21  Yes Wendie Agreste, MD  cromolyn (OPTICROM) 4 % ophthalmic solution Place 1 drop into both eyes 4 (four) times daily.   Yes [provider]  fluticasone-salmeterol (ADVAIR DISKUS) 250-50 MCG/ACT AEPB Inhale 1 puff into the lungs in the morning and at bedtime. 11/16/21  Yes Wendie Agreste, MD  Multiple Vitamin (MULTIVITAMIN) tablet Take 1 tablet by mouth daily. Over 50+ daily   Yes [provider]   Social History   Socioeconomic History   Marital status: Married    Spouse name: Not on file   Number of children: Not on file   Years of education: Not on file   Highest education level: Not on file  Occupational History   Not on file  Tobacco Use   Smoking status: Former    Types: Cigarettes   Smokeless tobacco: Never   Tobacco comments:    quit at age 88  Substance and Sexual Activity   Alcohol use: Yes    Comment: occ   Drug use: No   Sexual activity: Not Currently  Other Topics Concern   Not on file  Social History Narrative   Marital status: married      Children: 2 children; +  grandchildren 4      Lives:  With wife.      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   Social Determinants of Health   Financial Resource Strain: Not on file  Food Insecurity: Not on file  Transportation Needs: Not on file  Physical Activity: Not on file  Stress: Not on file  Social Connections: Not on file  Intimate Partner Violence: Not on file    Review of Systems Per HPI  Objective:   Vitals:   12/11/21 1128  BP: 128/76  Pulse: 63  Temp: 98 F (36.7 C)  TempSrc: Temporal  SpO2: 93%  Weight: 199 lb 9.6 oz (90.5 kg)  Height: '5\' 8"'$  (1.727 m)     Physical Exam Vitals reviewed.  Constitutional:      Appearance: He is well-developed.  HENT:      Head: Normocephalic and atraumatic.  Neck:     Vascular: No carotid bruit or JVD.  Cardiovascular:     Rate and Rhythm: Normal rate and regular rhythm.     Heart sounds: Normal heart sounds. No murmur heard. Pulmonary:     Effort: Pulmonary effort is normal.     Breath sounds: Normal breath sounds. No rales.  Musculoskeletal:     Right lower leg: No edema.     Left lower leg: No edema.  Skin:    General: Skin is warm and dry.  Neurological:     Mental Status: He is alert and oriented to person, place, and time.  Psychiatric:        Mood and Affect: Mood normal.        Assessment & Plan:  Blake Nash is a 66 y.o. male . Mild persistent asthma without complication  -Improved control on Advair, dyspnea improved.  Unlikely cardiac cause but with family history of early CAD and palpitations will refer to cardiology as below.  Continue same asthma meds for now with RTC precautions.  Hyperlipidemia, unspecified hyperlipidemia type Palpitations - Plan: Ambulatory referral to Cardiology Family history of early CAD - Plan: Ambulatory referral to Cardiology  -Rare palpitations, overall improved but with family history will refer to cardiology to decide on further testing.  RTC precautions given.  No orders of the defined types were placed in this encounter.  Blake Instructions  Glad to hear you are doing better, but I will refer you to cardiology to discuss the palpitations and further workup. Return to the clinic or go to the nearest emergency room if any of your symptoms worsen or new symptoms occur.  No change in Advair or other meds at this time.       Signed,   [name], MD [place] 12/11/21 1:09 PM

## 2021-12-12 ENCOUNTER — Ambulatory Visit: Payer: Medicare Other | Admitting: Cardiovascular Disease

## 2021-12-14 NOTE — Progress Notes (Signed)
Cardiology Office Note   Date:  12/15/2021   ID:  Blake Nash, DOB July 20, 1955, MRN [id number]  PCP:  Wendie Agreste, MD  Cardiologist:   None Referring:  Wendie Agreste, MD  Chief Complaint  Patient presents with   Palpitations   Chest Pain   Shortness of Breath      History of Present Illness: Blake Nash is a 66 y.o. male who presents for evaluation of shortness of breath.  Has been getting this with increasing dyspnea on exertion such as walking in his yard.  This has been progressive.  He has not been having any PND or orthopnea.  He has not been having any edema.  He does get some mild chest discomfort occasionally as well.  He has a strong family history for early coronary artery disease.  He has not had any prior cardiac work-up.  He does get palpitations.  He feels sporadic skipping beats.  He describes them as "not racing."  However, he might wake him from his sleep.  He will feel a skipping.  He does not associate it with anything except he did not know that he did feel a little bit better when he changed from Ventolin to Advair.  He is not having any presyncope or syncope.   Past Medical History:  Diagnosis Date   Allergy    Asthma    Depression    minor- resolved per pt    GERD (gastroesophageal reflux disease)    occasionally with diet changes    History of blood transfusion    1990's   Hyperlipidemia     Past Surgical History:  Procedure Laterality Date   COLONOSCOPY     FRACTURE SURGERY     HERNIA REPAIR     PILONIDAL CYST EXCISION  1980   POLYPECTOMY     VASECTOMY       Current Outpatient Medications  Medication Sig Dispense Refill   albuterol (VENTOLIN HFA) 108 (90 Base) MCG/ACT inhaler INHALE 1 TO 2 PUFFS INTO THE LUNGS EVERY 6 HOURS AS NEEDED 18 g 1   atorvastatin (LIPITOR) 20 MG tablet Take 1 tablet (20 mg total) by mouth daily. 90 tablet 1   fluticasone-salmeterol (ADVAIR DISKUS) 250-50 MCG/ACT AEPB Inhale 1 puff into the lungs in  the morning and at bedtime. 60 each 5   Multiple Vitamin (MULTIVITAMIN) tablet Take 1 tablet by mouth daily. Over 50+ daily     cromolyn (OPTICROM) 4 % ophthalmic solution Place 1 drop into both eyes 4 (four) times daily. (Patient not taking: Reported on 12/15/2021)     Current Facility-Administered Medications  Medication Dose Route Frequency Provider Last Rate Last Admin   0.9 %  sodium chloride infusion  500 mL Intravenous Continuous Milus Banister, MD        Allergies:   Gentamycin [gentamicin]    Social History:  The patient  reports that he has quit smoking. His smoking use included cigarettes. He has never used smokeless tobacco. He reports current alcohol use. He reports that he does not use drugs.   Family History:  The patient's family history includes Heart disease (age of onset: 49) in his father; Mental illness in his mother.    ROS:  Please see the history of present illness.   Otherwise, review of systems are positive for none.   All other systems are reviewed and negative.    PHYSICAL EXAM: VS:  BP 100/70   Pulse (!) 59  Ht '5\' 8"'$  (1.727 m)   Wt 201 lb 3.2 oz (91.3 kg)   SpO2 97%   BMI 30.59 kg/m  , BMI Body mass index is 30.59 kg/m. GENERAL:  Well appearing HEENT:  Pupils equal round and reactive, fundi not visualized, oral mucosa unremarkable NECK:  No jugular venous distention, waveform within normal limits, carotid upstroke brisk and symmetric, no bruits, no thyromegaly LYMPHATICS:  No cervical, inguinal adenopathy LUNGS:  Clear to auscultation bilaterally BACK:  No CVA tenderness CHEST:  Unremarkable HEART:  PMI not displaced or sustained,S1 and S2 within normal limits, no S3, no S4, no clicks, no rubs, no murmurs ABD:  Flat, positive bowel sounds normal in frequency in pitch, no bruits, no rebound, no guarding, no midline pulsatile mass, no hepatomegaly, no splenomegaly EXT:  2 plus pulses throughout, no edema, no cyanosis no clubbing SKIN:  No rashes no  nodules NEURO:  Cranial nerves II through XII grossly intact, motor grossly intact throughout PSYCH:  Cognitively intact, oriented to person place and time    EKG:  EKG is ordered today. The ekg ordered today demonstrates Sinus rhythm, rate 59, axis within normal limits, intervals within normal limits, no acute ST-T wave changes.   Recent Labs: 11/16/2021: ALT 21; BUN 24; Creatinine, Ser 1.09; Hemoglobin 13.5; Platelets 284.0; Potassium 4.3; Pro B Natriuretic peptide (BNP) 53.0; Sodium 137; TSH 1.98    Lipid Panel    Component Value Date/Time   CHOL 168 11/16/2021 1036   CHOL 200 (H) 01/29/2020 1033   TRIG 49.0 11/16/2021 1036   HDL 63.40 11/16/2021 1036   HDL 57 01/29/2020 1033   CHOLHDL 3 11/16/2021 1036   VLDL 9.8 11/16/2021 1036   LDLCALC 94 11/16/2021 1036   LDLCALC 132 (H) 01/29/2020 1033      Wt Readings from Last 3 Encounters:  12/15/21 201 lb 3.2 oz (91.3 kg)  12/11/21 199 lb 9.6 oz (90.5 kg)  11/16/21 195 lb (88.5 kg)      Other studies Reviewed: Additional studies/ records that were reviewed today include: Labs. Review of the above records demonstrates:  Please see elsewhere in the note.     ASSESSMENT AND PLAN:  Palpitations: The patient's palpitations are probably premature atrial contractions or premature ventricular contractions.  At this point I am not considering further therapy.  I did see that labs were unremarkable including a TSH recently and electrolytes.  He would let me know if they get worse but they seem to be better as he switched his bronchodilators.  SOB: He also has some chest discomfort.  His father had extensive three-vessel coronary disease in his 58s.  I think the pretest probability of obstructive coronary disease is moderate.  He needs a coronary CTA.    Dyslipidemia: His LDL was 94 with an HDL of 63.  Goals of therapy will be based on the results of the above.   Current medicines are reviewed at length with the patient today.  The  patient does not have concerns regarding medicines.  The following changes have been made:  no change  Labs/ tests ordered today include:   Orders Placed This Encounter  Procedures   CT CORONARY MORPH W/CTA COR W/SCORE W/CA W/CM &/OR WO/CM   Basic metabolic panel   EKG 99-MEQA     Disposition:   FU with me based on the results of the above   Signed, Minus Breeding, MD  12/15/2021 1:38 PM    [facility]

## 2021-12-15 ENCOUNTER — Ambulatory Visit: Payer: Medicare Other | Admitting: Cardiology

## 2021-12-15 ENCOUNTER — Encounter: Payer: Self-pay | Admitting: Cardiology

## 2021-12-15 VITALS — BP 100/70 | HR 59 | Ht 68.0 in | Wt 201.2 lb

## 2021-12-15 DIAGNOSIS — Z01812 Encounter for preprocedural laboratory examination: Secondary | ICD-10-CM | POA: Diagnosis not present

## 2021-12-15 DIAGNOSIS — R0602 Shortness of breath: Secondary | ICD-10-CM | POA: Diagnosis not present

## 2021-12-15 DIAGNOSIS — R079 Chest pain, unspecified: Secondary | ICD-10-CM

## 2021-12-15 NOTE — Patient Instructions (Addendum)
Medication Instructions:  Your Physician recommend you continue on your current medication as directed.    *If you need a refill on your cardiac medications before your next appointment, please call your pharmacy*   Lab Work: Your physician recommends lab work today (BMP)  If you have labs (blood work) drawn today and your tests are completely normal, you will receive your results only by: MyChart Message (if you have MyChart) OR A paper copy in the mail If you have any lab test that is abnormal or we need to change your treatment, we will call you to review the results.   Testing/Procedures: Cardiac CT Angiography (CTA), is a special type of CT scan that uses a computer to produce multi-dimensional views of major blood vessels throughout the body. In CT angiography, a contrast material is injected through an IV to help visualize the blood vessels  [facility]   Follow-Up: At [facility], you and your health needs are our priority.  As part of our continuing mission to provide you with exceptional heart care, we have created designated Provider Care Teams.  These Care Teams include your primary Cardiologist (physician) and Advanced Practice Providers (APPs -  Physician Assistants and Nurse Practitioners) who all work together to provide you with the care you need, when you need it.  We recommend signing up for the patient portal called "MyChart".  Sign up information is provided on this After Visit Summary.  MyChart is used to connect with patients for Virtual Visits (Telemedicine).  Patients are able to view lab/test results, encounter notes, upcoming appointments, etc.  Non-urgent messages can be sent to your provider as well.   To learn more about what you can do with MyChart, go to [email or web address].    Your next appointment:   As needed  The format for your next appointment:   In Person  Provider:   Dr. Percival Spanish     Your cardiac CT will be scheduled at one of  the below locations:   [facility] [address] [place],  [address] [phone]   If scheduled at [facility], please arrive at the [facility] and Children's Entrance (Entrance C2) of [facility] 30 minutes prior to test start time. You can use the FREE valet parking offered at entrance C (encouraged to control the heart rate for the test)  Proceed to the [facility] Radiology Department (first floor) to check-in and test prep.  All radiology patients and guests should use entrance C2 at [facility], accessed from [facility], even though the hospital's physical address listed is [address]    If scheduled at [facility], please arrive 15 mins early for check-in and test prep.  Please follow these instructions carefully (unless otherwise directed):  Hold all erectile dysfunction medications at least 3 days (72 hrs) prior to test.  On the Night Before the Test: Be sure to Drink plenty of water. Do not consume any caffeinated/decaffeinated beverages or chocolate 12 hours prior to your test. Do not take any antihistamines 12 hours prior to your test.   On the Day of the Test: Drink plenty of water until 1 hour prior to the test. Do not eat any food 4 hours prior to the test. You may take your regular medications prior to the test.        After the Test: Drink plenty of water. After receiving IV contrast, you may experience a mild flushed feeling.  This is normal. On occasion, you may experience a mild rash up to 24 hours after the test. This is not dangerous. If this occurs, you can take Benadryl 25 mg and increase your fluid intake. If you experience trouble breathing, this can be serious. If it is severe call 911 IMMEDIATELY. If it is mild, please call our office. If you take any of these medications: Glipizide/Metformin, Avandament, Glucavance, please do not take 48 hours after  completing test unless otherwise instructed.  We will call to schedule your test 2-4 weeks out understanding that some insurance companies will need an authorization prior to the service being performed.   For non-scheduling related questions, please contact the cardiac imaging nurse navigator should you have any questions/concerns: Marchia Bond, Cardiac Imaging Nurse Navigator Gordy Clement, Cardiac Imaging Nurse Navigator [facility] Direct Office Dial: [phone]   For scheduling needs, including cancellations and rescheduling, please call [place], [phone].

## 2021-12-16 LAB — BASIC METABOLIC PANEL
BUN/Creatinine Ratio: 19 (ref 10–24)
BUN: 19 mg/dL (ref 8–27)
CO2: 23 mmol/L (ref 20–29)
Calcium: 9.3 mg/dL (ref 8.6–10.2)
Chloride: 107 mmol/L — ABNORMAL HIGH (ref 96–106)
Creatinine, Ser: 1.01 mg/dL (ref 0.76–1.27)
Glucose: 79 mg/dL (ref 70–99)
Potassium: 5.1 mmol/L (ref 3.5–5.2)
Sodium: 143 mmol/L (ref 134–144)
eGFR: 83 mL/min/{1.73_m2} (ref >59)

## 2021-12-20 ENCOUNTER — Encounter: Payer: Self-pay | Admitting: Family Medicine

## 2021-12-25 ENCOUNTER — Telehealth: Payer: Self-pay

## 2021-12-25 NOTE — Telephone Encounter (Signed)
Did you need pt to have CT done prior to the Thursday appt? Appears not the only reason for follow up

## 2021-12-25 NOTE — Telephone Encounter (Signed)
Okay to cancel appointment on Thursday as CT scan ordered by cardiology.

## 2021-12-25 NOTE — Telephone Encounter (Signed)
Patient states he has an appointment for a follow up this Thursday and was seeing if he needs to reschedule because he can not get the CT scan until September.

## 2021-12-26 NOTE — Telephone Encounter (Signed)
Called and informed pt they will follow up after the scans are done and he sees Cards

## 2021-12-28 ENCOUNTER — Ambulatory Visit: Payer: Medicare Other | Admitting: Family Medicine

## 2022-01-05 ENCOUNTER — Telehealth (HOSPITAL_COMMUNITY): Payer: Self-pay | Admitting: *Deleted

## 2022-01-05 NOTE — Telephone Encounter (Signed)
Reaching out to patient to offer assistance regarding upcoming cardiac imaging study; pt verbalizes understanding of appt date/time, parking situation and where to check in, pre-test NPO status, and verified current allergies; name and call back number provided for further questions should they arise  Gordy Clement RN Navigator Cardiac [place] [facility] [phone] office [phone] cell  Patient aware to arrive at 2:30pm.

## 2022-01-09 ENCOUNTER — Ambulatory Visit (HOSPITAL_COMMUNITY)
Admission: RE | Admit: 2022-01-09 | Discharge: 2022-01-09 | Disposition: A | Discharge status: Home / Self Care | Payer: Medicare Other | Source: Ambulatory Visit | Attending: Cardiology | Admitting: Cardiology

## 2022-01-09 DIAGNOSIS — R0789 Other chest pain: Secondary | ICD-10-CM | POA: Diagnosis not present

## 2022-01-09 DIAGNOSIS — R0602 Shortness of breath: Secondary | ICD-10-CM | POA: Diagnosis not present

## 2022-01-09 DIAGNOSIS — R002 Palpitations: Secondary | ICD-10-CM

## 2022-01-09 DIAGNOSIS — R079 Chest pain, unspecified: Secondary | ICD-10-CM | POA: Insufficient documentation

## 2022-01-09 DIAGNOSIS — R911 Solitary pulmonary nodule: Secondary | ICD-10-CM | POA: Diagnosis not present

## 2022-01-09 DIAGNOSIS — I2510 Atherosclerotic heart disease of native coronary artery without angina pectoris: Secondary | ICD-10-CM | POA: Insufficient documentation

## 2022-01-09 DIAGNOSIS — R9431 Abnormal electrocardiogram [ECG] [EKG]: Secondary | ICD-10-CM

## 2022-01-09 MED ORDER — NITROGLYCERIN 0.4 MG SL SUBL
0.8000 mg | SUBLINGUAL_TABLET | Freq: Once | SUBLINGUAL | Status: AC
  Administered 2022-01-09: 0.8 mg via SUBLINGUAL

## 2022-01-09 MED ORDER — NITROGLYCERIN 0.4 MG SL SUBL
SUBLINGUAL_TABLET | SUBLINGUAL | Status: AC
  Filled 2022-01-09: qty 2

## 2022-01-09 MED ORDER — IOHEXOL 350 MG/ML SOLN
100.0000 mL | Freq: Once | INTRAVENOUS | Status: AC | PRN
  Administered 2022-01-09: 100 mL via INTRAVENOUS

## 2022-03-26 IMAGING — DX DG CHEST 2V
2 series · 2 of 2 positions shown · non-contrast
Comparison: None.

CLINICAL DATA: 63-year-old male with cough.

EXAM:
CHEST - 2 VIEW

[chest pa]
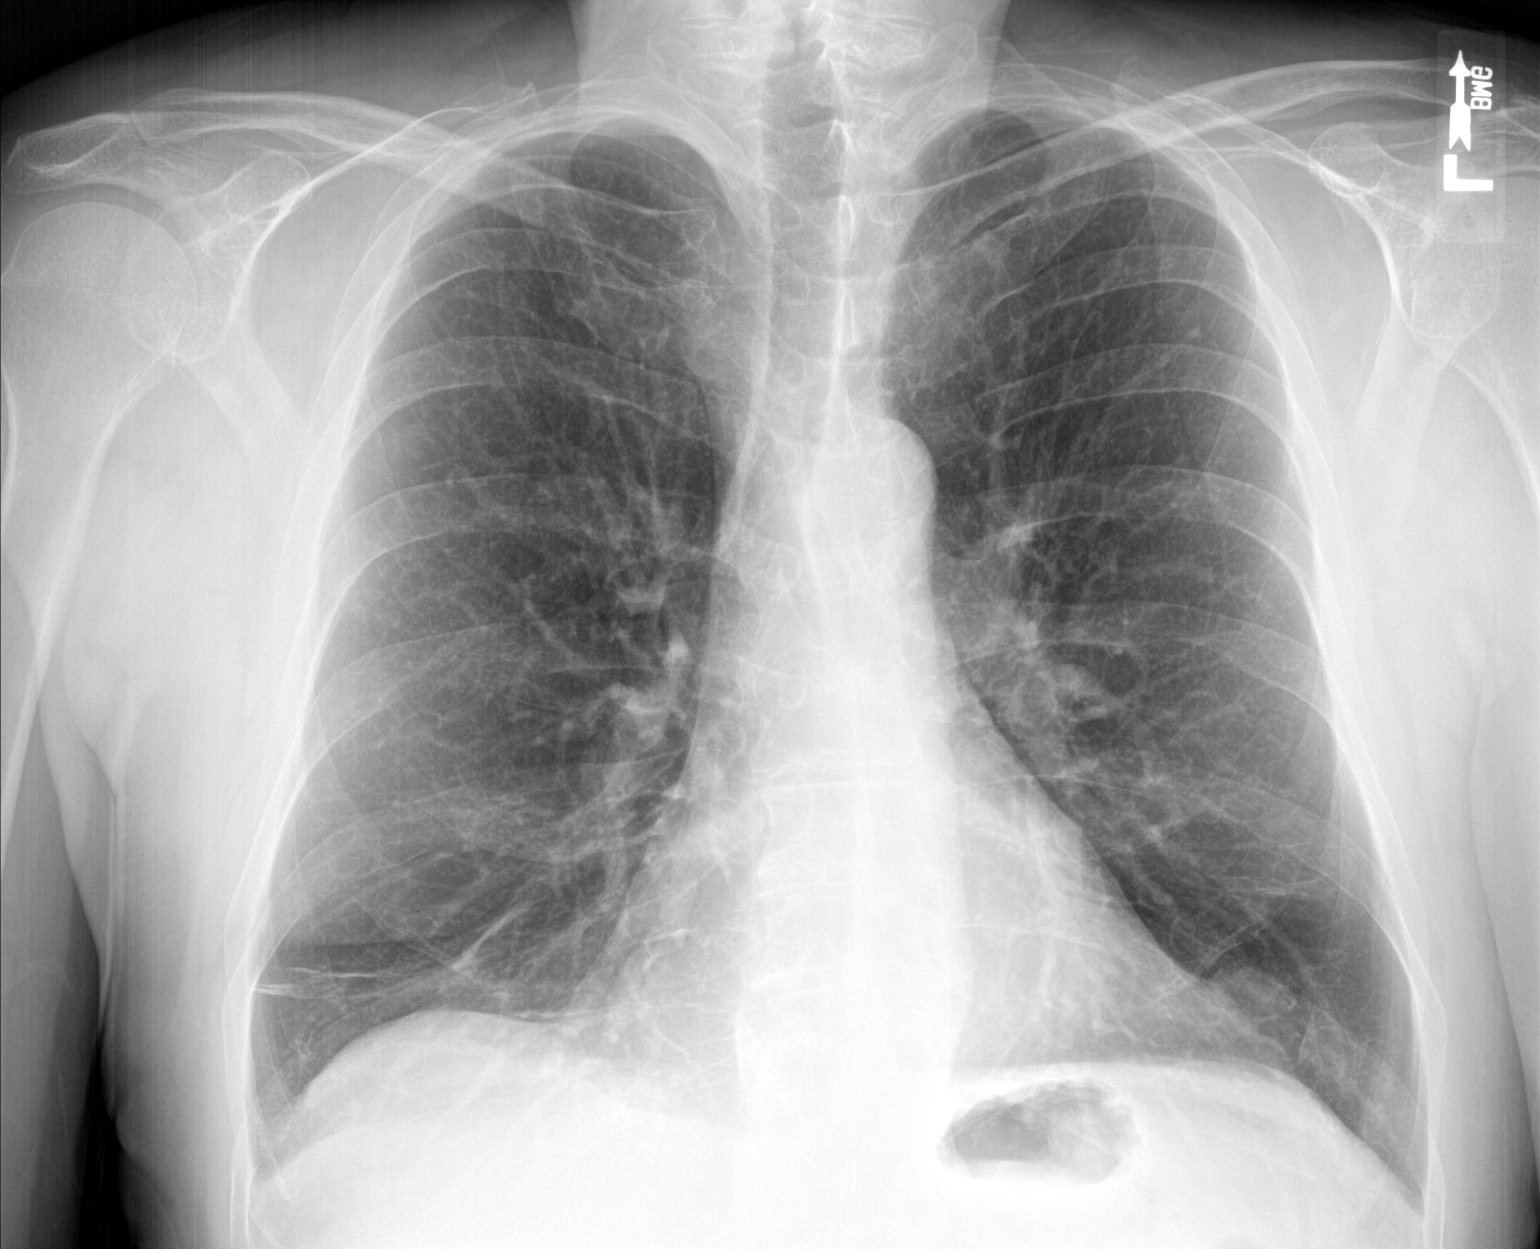

[chest lat]
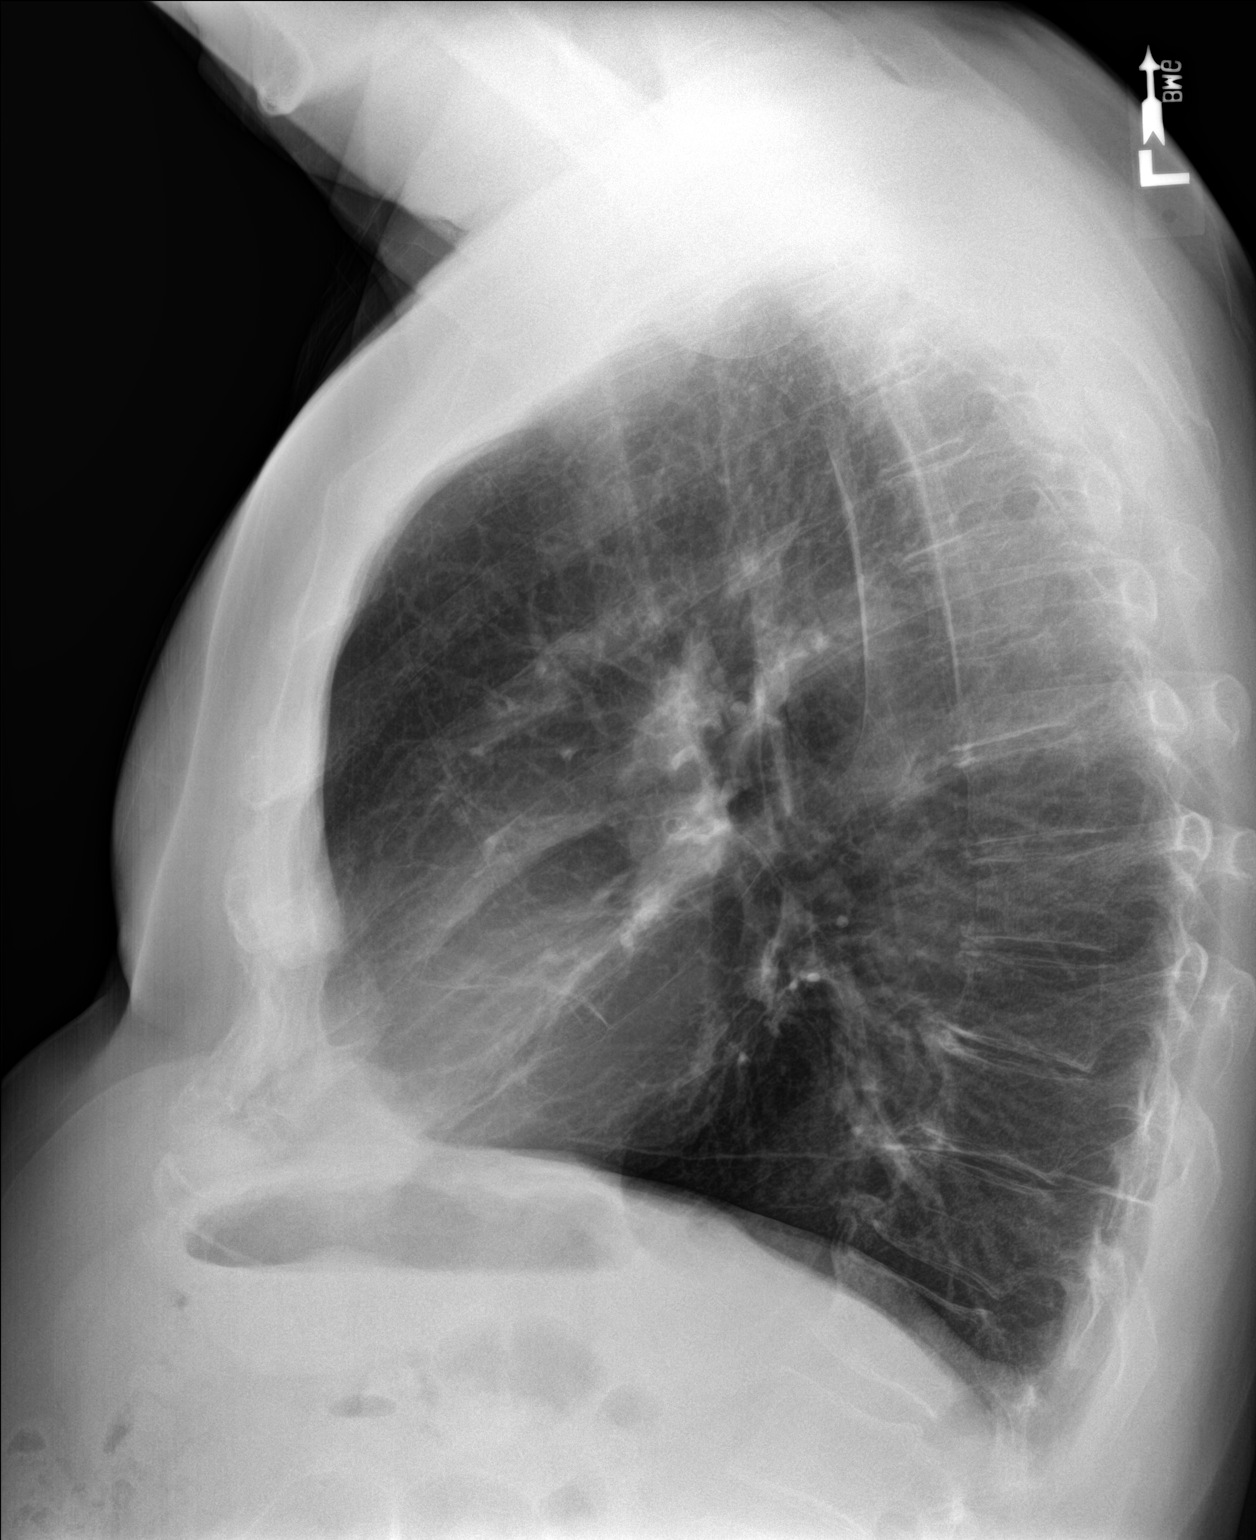

[2 of 2 positions shown; findings below may reference images not displayed]

FINDINGS: No focal consolidation, pleural effusion, pneumothorax. Background
of emphysema. The cardiac silhouette is within limits. No acute
osseous pathology. Degenerative changes of the spine.
IMPRESSION: No active cardiopulmonary disease.

## 2022-05-14 ENCOUNTER — Encounter: Payer: Self-pay | Admitting: Family Medicine

## 2022-05-14 ENCOUNTER — Ambulatory Visit (INDEPENDENT_AMBULATORY_CARE_PROVIDER_SITE_OTHER): Payer: Medicare Other | Admitting: Family Medicine

## 2022-05-14 VITALS — BP 108/72 | HR 58 | Temp 98.0°F | Wt 209.6 lb

## 2022-05-14 DIAGNOSIS — J4530 Mild persistent asthma, uncomplicated: Secondary | ICD-10-CM

## 2022-05-14 DIAGNOSIS — E785 Hyperlipidemia, unspecified: Secondary | ICD-10-CM

## 2022-05-14 DIAGNOSIS — R12 Heartburn: Secondary | ICD-10-CM

## 2022-05-14 LAB — CBC
HCT: 42.6 % (ref 39.0–52.0)
Hemoglobin: 14.4 g/dL (ref 13.0–17.0)
MCHC: 33.9 g/dL (ref 30.0–36.0)
MCV: 94 fl (ref 78.0–100.0)
Platelets: 291 10*3/uL (ref 150.0–400.0)
RBC: 4.53 Mil/uL (ref 4.22–5.81)
RDW: 13.5 % (ref 11.5–15.5)
WBC: 5.3 10*3/uL (ref 4.0–10.5)

## 2022-05-14 LAB — COMPREHENSIVE METABOLIC PANEL
ALT: 22 U/L (ref 0–53)
AST: 24 U/L (ref 0–37)
Albumin: 4.4 g/dL (ref 3.5–5.2)
Alkaline Phosphatase: 57 U/L (ref 39–117)
BUN: 15 mg/dL (ref 6–23)
CO2: 28 mEq/L (ref 19–32)
Calcium: 9.4 mg/dL (ref 8.4–10.5)
Chloride: 103 mEq/L (ref 96–112)
Creatinine, Ser: 1.07 mg/dL (ref 0.40–1.50)
GFR: 72.48 mL/min (ref >60.00)
Glucose, Bld: 92 mg/dL (ref 70–99)
Potassium: 4.8 mEq/L (ref 3.5–5.1)
Sodium: 139 mEq/L (ref 135–145)
Total Bilirubin: 0.6 mg/dL (ref 0.2–1.2)
Total Protein: 7.4 g/dL (ref 6.0–8.3)

## 2022-05-14 LAB — LIPID PANEL
Cholesterol: 181 mg/dL (ref 0–200)
HDL: 57.2 mg/dL (ref >39.00)
LDL Cholesterol: 108 mg/dL — ABNORMAL HIGH (ref 0–99)
NonHDL: 123.87
Total CHOL/HDL Ratio: 3
Triglycerides: 77 mg/dL (ref 0.0–149.0)
VLDL: 15.4 mg/dL (ref 0.0–40.0)

## 2022-05-14 MED ORDER — PANTOPRAZOLE SODIUM 40 MG PO TBEC: 40.0000 mg | DELAYED_RELEASE_TABLET | Freq: Every day | ORAL | 3 refills | Status: DC

## 2022-05-14 MED ORDER — FLUTICASONE-SALMETEROL 250-50 MCG/ACT IN AEPB: 1.0000 | INHALATION_SPRAY | Freq: Two times a day (BID) | RESPIRATORY_TRACT | 5 refills | Status: DC

## 2022-05-14 MED ORDER — ATORVASTATIN CALCIUM 20 MG PO TABS: 20.0000 mg | ORAL_TABLET | Freq: Every day | ORAL | 1 refills | Status: DC

## 2022-05-14 NOTE — Progress Notes (Signed)
Subjective:  Patient ID: Blake Nash, male    DOB: May 06, 1956  Age: 67 y.o. MRN: [id number]  CC:  Chief Complaint  Patient presents with   Hyperlipidemia    HPI Blake Nash presents for  Follow up.   Heartburn: Noticed about a month ago. Woke up at night at times with heartburn. Took wife's protonix, helped more than TUMS.  Improved on daily PPI.  No fever, unexplained weight loss or persistent night sweats. Rare warm feeling at night.  No n/v, change in stools or melena/hematochezia.  Mild persistent asthma Doing well with Advair. No new side effects. Rinsing after. Rare use albuterol - few months ago.   Hyperlipidemia: Lipitor '20mg'$  QD. No new myalgias. Lab Results  Component Value Date   CHOL 168 11/16/2021   HDL 63.40 11/16/2021   LDLCALC 94 11/16/2021   TRIG 49.0 11/16/2021   CHOLHDL 3 11/16/2021   Lab Results  Component Value Date   ALT 21 11/16/2021   AST 24 11/16/2021   ALKPHOS 70 11/16/2021   BILITOT 0.8 11/16/2021    History Patient Active Problem List   Diagnosis Date Noted   Asthma 01/29/2012   Allergic rhinitis 01/29/2012   Past Medical History:  Diagnosis Date   Allergy    Asthma    Depression    minor- resolved per pt    GERD (gastroesophageal reflux disease)    occasionally with diet changes    History of blood transfusion    1990's   Hyperlipidemia    Past Surgical History:  Procedure Laterality Date   COLONOSCOPY     FRACTURE SURGERY     HERNIA REPAIR     PILONIDAL CYST EXCISION  1980   POLYPECTOMY     VASECTOMY     Allergies  Allergen Reactions   Gentamycin [Gentamicin] Other (See Comments)    Eye drop   Prior to Admission medications   Medication Sig Start Date End Date Taking? Authorizing Provider  albuterol (VENTOLIN HFA) 108 (90 Base) MCG/ACT inhaler INHALE 1 TO 2 PUFFS INTO THE LUNGS EVERY 6 HOURS AS NEEDED 11/16/21  Yes Wendie Agreste, MD  atorvastatin (LIPITOR) 20 MG tablet Take 1 tablet (20 mg total) by  mouth daily. 11/16/21  Yes Wendie Agreste, MD  fluticasone-salmeterol (ADVAIR DISKUS) 250-50 MCG/ACT AEPB Inhale 1 puff into the lungs in the morning and at bedtime. 11/16/21  Yes Wendie Agreste, MD  Multiple Vitamin (MULTIVITAMIN) tablet Take 1 tablet by mouth daily. Over 50+ daily   Yes [provider]  cromolyn (OPTICROM) 4 % ophthalmic solution Place 1 drop into both eyes 4 (four) times daily. Patient not taking: Reported on 12/15/2021    [provider]   Social History   Socioeconomic History   Marital status: Married    Spouse name: Not on file   Number of children: Not on file   Years of education: Not on file   Highest education level: Not on file  Occupational History   Not on file  Tobacco Use   Smoking status: Former    Types: Cigarettes   Smokeless tobacco: Never   Tobacco comments:    quit at age 54  Substance and Sexual Activity   Alcohol use: Yes    Comment: occ   Drug use: No   Sexual activity: Not Currently  Other Topics Concern   Not on file  Social History Narrative   Marital status: married      Children: 2 children; +  grandchildren 4      Lives:  With wife.      Employment:  Working; Surveyor, quantity businesses      Tobacco: quit age 66      Alcohol: socially      Exercise: walks dogs three times per day; [place]. 1.5 miles per day.  Training on the farm on weekends.   Social Determinants of Health   Financial Resource Strain: Not on file  Food Insecurity: Not on file  Transportation Needs: Not on file  Physical Activity: Not on file  Stress: Not on file  Social Connections: Not on file  Intimate Partner Violence: Not on file    Review of Systems  Constitutional:  Negative for fatigue and unexpected weight change.  Eyes:  Negative for visual disturbance.  Respiratory:  Negative for cough, chest tightness and shortness of breath.   Cardiovascular:  Negative for chest pain, palpitations and leg swelling.  Gastrointestinal:   Negative for abdominal pain and blood in stool.  Neurological:  Negative for dizziness, light-headedness and headaches.   Per HPI.   Objective:   Vitals:   05/14/22 1045  BP: 108/72  Pulse: (!) 58  Temp: 98 F (36.7 C)  SpO2: 95%  Weight: 209 lb 9.6 oz (95.1 kg)     Physical Exam Vitals reviewed.  Constitutional:      Appearance: He is well-developed.  HENT:     Head: Normocephalic and atraumatic.  Neck:     Vascular: No carotid bruit or JVD.  Cardiovascular:     Rate and Rhythm: Normal rate and regular rhythm.     Heart sounds: Normal heart sounds. No murmur heard. Pulmonary:     Effort: Pulmonary effort is normal.     Breath sounds: Normal breath sounds. No rales.  Musculoskeletal:     Right lower leg: No edema.     Left lower leg: No edema.  Skin:    General: Skin is warm and dry.  Neurological:     Mental Status: He is alert and oriented to person, place, and time.  Psychiatric:        Mood and Affect: Mood normal.    Assessment & Plan:  Blake Nash is a 67 y.o. male . Mild persistent asthma without complication -  Stable, tolerating current regimen. Medications refilled.   Hyperlipidemia, unspecified hyperlipidemia type - Plan: Comprehensive metabolic panel, Lipid panel  -Tolerating current regimen, continue same dose Lipitor.  Check updated labs.  Heartburn - Plan: CBC  -New concern, trigger avoidance discussed, Protonix ordered for now handout given, check CBC.  Consider GI follow-up if persistent need for PPI.  RTC precautions.  No orders of the defined types were placed in this encounter.  Patient Instructions   See below for heartburn and avoidance strategies. Protonix up to daily ok for now.  Return to the clinic or go to the nearest emergency room if any of your symptoms worsen or new symptoms occur. If you continue to require protonix more than a month or two, let me know and I can refer you to gastroenterology.   Food Choices for  Gastroesophageal Reflux Disease, Adult When you have gastroesophageal reflux disease (GERD), the foods you eat and your eating habits are very important. Choosing the right foods can help ease the discomfort of GERD. Consider working with a dietitian to help you make healthy food choices. What are tips for following this plan? Reading food labels Look for foods that are low in saturated fat. Foods  that have less than 5% of daily value (DV) of fat and 0 g of trans fats may help with your symptoms. Cooking Cook foods using methods other than frying. This may include baking, steaming, grilling, or broiling. These are all methods that do not need a lot of fat for cooking. To add flavor, try to use herbs that are low in spice and acidity. Meal planning  Choose healthy foods that are low in fat, such as fruits, vegetables, whole grains, low-fat dairy products, lean meats, fish, and poultry. Eat frequent, small meals instead of three large meals each day. Eat your meals slowly, in a relaxed setting. Avoid bending over or lying down until 2-3 hours after eating. Limit high-fat foods such as fatty meats or fried foods. Limit your intake of fatty foods, such as oils, butter, and shortening. Avoid the following as told by your health care provider: Foods that cause symptoms. These may be different for different people. Keep a food diary to keep track of foods that cause symptoms. Alcohol. Drinking large amounts of liquid with meals. Eating meals during the 2-3 hours before bed. Lifestyle Maintain a healthy weight. Ask your health care provider what weight is healthy for you. If you need to lose weight, work with your health care provider to do so safely. Exercise for at least 30 minutes on 5 or more days each week, or as told by your health care provider. Avoid wearing clothes that fit tightly around your waist and chest. Do not use any products that contain nicotine or tobacco. These products include  cigarettes, chewing tobacco, and vaping devices, such as e-cigarettes. If you need help quitting, ask your health care provider. Sleep with the head of your bed raised. Use a wedge under the mattress or blocks under the bed frame to raise the head of the bed. Chew sugar-free gum after mealtimes. What foods should I eat?  Eat a healthy, well-balanced diet of fruits, vegetables, whole grains, low-fat dairy products, lean meats, fish, and poultry. Each person is different. Foods that may trigger symptoms in one person may not trigger any symptoms in another person. Work with your health care provider to identify foods that are safe for you. The items listed above may not be a complete list of recommended foods and beverages. Contact a dietitian for more information. What foods should I avoid? Limiting some of these foods may help manage the symptoms of GERD. Everyone is different. Consult a dietitian or your health care provider to help you identify the exact foods to avoid, if any. Fruits Any fruits prepared with added fat. Any fruits that cause symptoms. For some people this may include citrus fruits, such as oranges, grapefruit, pineapple, and lemons. Vegetables Deep-fried vegetables. [place] fries. Any vegetables prepared with added fat. Any vegetables that cause symptoms. For some people, this may include tomatoes and tomato products, chili peppers, onions and garlic, and horseradish. Grains Pastries or quick breads with added fat. Meats and other proteins High-fat meats, such as fatty beef or pork, hot dogs, ribs, ham, sausage, salami, and bacon. Fried meat or protein, including fried fish and fried chicken. Nuts and nut butters, in large amounts. Dairy Whole milk and chocolate milk. Sour cream. Cream. Ice cream. Cream cheese. Milkshakes. Fats and oils Butter. Margarine. Shortening. Ghee. Beverages Coffee and tea, with or without caffeine. Carbonated beverages. Sodas. Energy drinks. Fruit  juice made with acidic fruits, such as orange or grapefruit. Tomato juice. Alcoholic drinks. Sweets and desserts Chocolate and cocoa.  Donuts. Seasonings and condiments Pepper. Peppermint and spearmint. Added salt. Any condiments, herbs, or seasonings that cause symptoms. For some people, this may include curry, hot sauce, or vinegar-based salad dressings. The items listed above may not be a complete list of foods and beverages to avoid. Contact a dietitian for more information. Questions to ask your health care provider Diet and lifestyle changes are usually the first steps that are taken to manage symptoms of GERD. If diet and lifestyle changes do not improve your symptoms, talk with your health care provider about taking medicines. Where to find more information International Foundation for Gastrointestinal Disorders: aboutgerd.org Summary When you have gastroesophageal reflux disease (GERD), food and lifestyle choices may be very helpful in easing the discomfort of GERD. Eat frequent, small meals instead of three large meals each day. Eat your meals slowly, in a relaxed setting. Avoid bending over or lying down until 2-3 hours after eating. Limit high-fat foods such as fatty meats or fried foods. This information is not intended to replace advice given to you by your health care provider. Make sure you discuss any questions you have with your health care provider. Document Revised: 11/02/2019 Document Reviewed: 11/02/2019 Elsevier Patient Education  [place],   [name], MD [place] 05/14/22 11:43 AM

## 2022-05-14 NOTE — Patient Instructions (Addendum)
See below for heartburn and avoidance strategies. Protonix up to daily ok for now.  Return to the clinic or go to the nearest emergency room if any of your symptoms worsen or new symptoms occur. If you continue to require protonix more than a month or two, let me know and I can refer you to gastroenterology.  No other med changes, I will let you know if any concerns on labs.    Food Choices for Gastroesophageal Reflux Disease, Adult When you have gastroesophageal reflux disease (GERD), the foods you eat and your eating habits are very important. Choosing the right foods can help ease the discomfort of GERD. Consider working with a dietitian to help you make healthy food choices. What are tips for following this plan? Reading food labels Look for foods that are low in saturated fat. Foods that have less than 5% of daily value (DV) of fat and 0 g of trans fats may help with your symptoms. Cooking Cook foods using methods other than frying. This may include baking, steaming, grilling, or broiling. These are all methods that do not need a lot of fat for cooking. To add flavor, try to use herbs that are low in spice and acidity. Meal planning  Choose healthy foods that are low in fat, such as fruits, vegetables, whole grains, low-fat dairy products, lean meats, fish, and poultry. Eat frequent, small meals instead of three large meals each day. Eat your meals slowly, in a relaxed setting. Avoid bending over or lying down until 2-3 hours after eating. Limit high-fat foods such as fatty meats or fried foods. Limit your intake of fatty foods, such as oils, butter, and shortening. Avoid the following as told by your health care provider: Foods that cause symptoms. These may be different for different people. Keep a food diary to keep track of foods that cause symptoms. Alcohol. Drinking large amounts of liquid with meals. Eating meals during the 2-3 hours before bed. Lifestyle Maintain a healthy  weight. Ask your health care provider what weight is healthy for you. If you need to lose weight, work with your health care provider to do so safely. Exercise for at least 30 minutes on 5 or more days each week, or as told by your health care provider. Avoid wearing clothes that fit tightly around your waist and chest. Do not use any products that contain nicotine or tobacco. These products include cigarettes, chewing tobacco, and vaping devices, such as e-cigarettes. If you need help quitting, ask your health care provider. Sleep with the head of your bed raised. Use a wedge under the mattress or blocks under the bed frame to raise the head of the bed. Chew sugar-free gum after mealtimes. What foods should I eat?  Eat a healthy, well-balanced diet of fruits, vegetables, whole grains, low-fat dairy products, lean meats, fish, and poultry. Each person is different. Foods that may trigger symptoms in one person may not trigger any symptoms in another person. Work with your health care provider to identify foods that are safe for you. The items listed above may not be a complete list of recommended foods and beverages. Contact a dietitian for more information. What foods should I avoid? Limiting some of these foods may help manage the symptoms of GERD. Everyone is different. Consult a dietitian or your health care provider to help you identify the exact foods to avoid, if any. Fruits Any fruits prepared with added fat. Any fruits that cause symptoms. For some people this may  include citrus fruits, such as oranges, grapefruit, pineapple, and lemons. Vegetables Deep-fried vegetables. [place] fries. Any vegetables prepared with added fat. Any vegetables that cause symptoms. For some people, this may include tomatoes and tomato products, chili peppers, onions and garlic, and horseradish. Grains Pastries or quick breads with added fat. Meats and other proteins High-fat meats, such as fatty beef or pork,  hot dogs, ribs, ham, sausage, salami, and bacon. Fried meat or protein, including fried fish and fried chicken. Nuts and nut butters, in large amounts. Dairy Whole milk and chocolate milk. Sour cream. Cream. Ice cream. Cream cheese. Milkshakes. Fats and oils Butter. Margarine. Shortening. Ghee. Beverages Coffee and tea, with or without caffeine. Carbonated beverages. Sodas. Energy drinks. Fruit juice made with acidic fruits, such as orange or grapefruit. Tomato juice. Alcoholic drinks. Sweets and desserts Chocolate and cocoa. Donuts. Seasonings and condiments Pepper. Peppermint and spearmint. Added salt. Any condiments, herbs, or seasonings that cause symptoms. For some people, this may include curry, hot sauce, or vinegar-based salad dressings. The items listed above may not be a complete list of foods and beverages to avoid. Contact a dietitian for more information. Questions to ask your health care provider Diet and lifestyle changes are usually the first steps that are taken to manage symptoms of GERD. If diet and lifestyle changes do not improve your symptoms, talk with your health care provider about taking medicines. Where to find more information International Foundation for Gastrointestinal Disorders: aboutgerd.org Summary When you have gastroesophageal reflux disease (GERD), food and lifestyle choices may be very helpful in easing the discomfort of GERD. Eat frequent, small meals instead of three large meals each day. Eat your meals slowly, in a relaxed setting. Avoid bending over or lying down until 2-3 hours after eating. Limit high-fat foods such as fatty meats or fried foods. This information is not intended to replace advice given to you by your health care provider. Make sure you discuss any questions you have with your health care provider. Document Revised: 11/02/2019 Document Reviewed: 11/02/2019 Elsevier Patient Education  [place].

## 2022-06-07 NOTE — Progress Notes (Signed)
This encounter was created in error - please disregard.

## 2022-06-20 ENCOUNTER — Ambulatory Visit: Payer: Medicare Other

## 2022-07-14 ENCOUNTER — Other Ambulatory Visit: Payer: Self-pay | Admitting: Family Medicine

## 2022-07-14 DIAGNOSIS — J4530 Mild persistent asthma, uncomplicated: Secondary | ICD-10-CM

## 2022-08-10 ENCOUNTER — Telehealth: Payer: Self-pay | Admitting: Family Medicine

## 2022-08-10 NOTE — Telephone Encounter (Signed)
Called patient to schedule Medicare Annual Wellness Visit (AWV). Left message for patient to call back and schedule Medicare Annual Wellness Visit (AWV).  Last date of AWV: Welcome to Medicare: 06/12/2021   Please schedule an AWVI appointment at any time with [facility] ANNUAL WELLNESS VISIT.  If any questions, please contact me at [phone].    Thank you,  [facility] Direct dial  [phone]

## 2022-08-23 ENCOUNTER — Telehealth: Payer: Self-pay | Admitting: Family Medicine

## 2022-08-23 NOTE — Telephone Encounter (Signed)
Called patient to schedule Medicare Annual Wellness Visit (AWV). Left message for patient to call back and schedule Medicare Annual Wellness Visit (AWV).  Last date of AWV: Welcome to Medicare: 06/12/2021   Please schedule an AWVI appointment at any time with [facility] ANNUAL WELLNESS VISIT.  If any questions, please contact me at [phone].    Thank you,  Zaleah Ternes  [facility]  [facility] Direct dial  [phone]   

## 2022-09-05 ENCOUNTER — Telehealth: Payer: Self-pay | Admitting: Family Medicine

## 2022-09-05 NOTE — Telephone Encounter (Signed)
Called patient to schedule Medicare Annual Wellness Visit (AWV). Left message for patient to call back and schedule Medicare Annual Wellness Visit (AWV).  Last date of AWV: Welcome to Medicare: 06/12/2021   Please schedule an AWVI appointment at any time with [facility] ANNUAL WELLNESS VISIT.  If any questions, please contact me at [phone].    Thank you,  Asal Teas  [facility] Direct dial  [phone]   

## 2022-09-12 ENCOUNTER — Ambulatory Visit (INDEPENDENT_AMBULATORY_CARE_PROVIDER_SITE_OTHER): Payer: Medicare Other | Admitting: Family Medicine

## 2022-09-12 ENCOUNTER — Encounter: Payer: Self-pay | Admitting: Family Medicine

## 2022-09-12 ENCOUNTER — Other Ambulatory Visit: Payer: Self-pay | Admitting: Family Medicine

## 2022-09-12 VITALS — BP 104/62 | HR 95 | Temp 98.9°F | Ht 68.0 in | Wt 199.4 lb

## 2022-09-12 DIAGNOSIS — E785 Hyperlipidemia, unspecified: Secondary | ICD-10-CM

## 2022-09-12 DIAGNOSIS — R12 Heartburn: Secondary | ICD-10-CM

## 2022-09-12 DIAGNOSIS — Z0000 Encounter for general adult medical examination without abnormal findings: Secondary | ICD-10-CM | POA: Diagnosis not present

## 2022-09-12 DIAGNOSIS — Z131 Encounter for screening for diabetes mellitus: Secondary | ICD-10-CM | POA: Diagnosis not present

## 2022-09-12 DIAGNOSIS — R7303 Prediabetes: Secondary | ICD-10-CM | POA: Diagnosis not present

## 2022-09-12 MED ORDER — ATORVASTATIN CALCIUM 20 MG PO TABS: 20.0000 mg | ORAL_TABLET | Freq: Every day | ORAL | 1 refills | Status: DC

## 2022-09-12 NOTE — Patient Instructions (Signed)
Glad to hear you are doing well, congratulations on the weight loss.  Keep up the good work.  If you have not had the RSV vaccine to your pharmacy it is what I would recommend.  If any concerns on your labs I will let you know.  Follow-up in 6 months.  Preventive Care 29 Years and Older, Male Preventive care refers to lifestyle choices and visits with your health care provider that can promote health and wellness. Preventive care visits are also called wellness exams. What can I expect for my preventive care visit? Counseling During your preventive care visit, your health care provider may ask about your: Medical history, including: Past medical problems. Family medical history. History of falls. Current health, including: Emotional well-being. Home life and relationship well-being. Sexual activity. Memory and ability to understand (cognition). Lifestyle, including: Alcohol, nicotine or tobacco, and drug use. Access to firearms. Diet, exercise, and sleep habits. Work and work Astronomer. Sunscreen use. Safety issues such as seatbelt and bike helmet use. Physical exam Your health care provider will check your: Height and weight. These may be used to calculate your BMI (body mass index). BMI is a measurement that tells if you are at a healthy weight. Waist circumference. This measures the distance around your waistline. This measurement also tells if you are at a healthy weight and may help predict your risk of certain diseases, such as type 2 diabetes and high blood pressure. Heart rate and blood pressure. Body temperature. Skin for abnormal spots. What immunizations do I need?  Vaccines are usually given at various ages, according to a schedule. Your health care provider will recommend vaccines for you based on your age, medical history, and lifestyle or other factors, such as travel or where you work. What tests do I need? Screening Your health care provider may recommend  screening tests for certain conditions. This may include: Lipid and cholesterol levels. Diabetes screening. This is done by checking your blood sugar (glucose) after you have not eaten for a while (fasting). Hepatitis C test. Hepatitis B test. HIV (human immunodeficiency virus) test. STI (sexually transmitted infection) testing, if you are at risk. Lung cancer screening. Colorectal cancer screening. Prostate cancer screening. Abdominal aortic aneurysm (AAA) screening. You may need this if you are a current or former smoker. Talk with your health care provider about your test results, treatment options, and if necessary, the need for more tests. Follow these instructions at home: Eating and drinking  Eat a diet that includes fresh fruits and vegetables, whole grains, lean protein, and low-fat dairy products. Limit your intake of foods with high amounts of sugar, saturated fats, and salt. Take vitamin and mineral supplements as recommended by your health care provider. Do not drink alcohol if your health care provider tells you not to drink. If you drink alcohol: Limit how much you have to 0-2 drinks a day. Know how much alcohol is in your drink. In the [place] one drink equals one 12 oz bottle of beer (355 mL), one 5 oz glass of wine (148 mL), or one 1 oz glass of hard liquor (44 mL). Lifestyle Brush your teeth every morning and night with fluoride toothpaste. Floss one time each day. Exercise for at least 30 minutes 5 or more days each week. Do not use any products that contain nicotine or tobacco. These products include cigarettes, chewing tobacco, and vaping devices, such as e-cigarettes. If you need help quitting, ask your health care provider. Do not use drugs. If you  are sexually active, practice safe sex. Use a condom or other form of protection to prevent STIs. Take aspirin only as told by your health care provider. Make sure that you understand how much to take and what form to  take. Work with your health care provider to find out whether it is safe and beneficial for you to take aspirin daily. Ask your health care provider if you need to take a cholesterol-lowering medicine (statin). Find healthy ways to manage stress, such as: Meditation, yoga, or listening to music. Journaling. Talking to a trusted person. Spending time with friends and family. Safety Always wear your seat belt while driving or riding in a vehicle. Do not drive: If you have been drinking alcohol. Do not ride with someone who has been drinking. When you are tired or distracted. While texting. If you have been using any mind-altering substances or drugs. Wear a helmet and other protective equipment during sports activities. If you have firearms in your house, make sure you follow all gun safety procedures. Minimize exposure to UV radiation to reduce your risk of skin cancer. What's next? Visit your health care provider once a year for an annual wellness visit. Ask your health care provider how often you should have your eyes and teeth checked. Stay up to date on all vaccines. This information is not intended to replace advice given to you by your health care provider. Make sure you discuss any questions you have with your health care provider. Document Revised: 10/19/2020 Document Reviewed: 10/19/2020 Elsevier Patient Education  2023 [employer].

## 2022-09-12 NOTE — Progress Notes (Signed)
Subjective:  Patient ID: Blake Nash, male    DOB: 1956-02-03  Age: 67 y.o. MRN: [id number]  CC:  Chief Complaint  Patient presents with   Annual Exam    No concerns     HPI Blake Nash presents for Annual Exam Doing well.  Has been trying to lose some weight - walking dogs 5 times per day and watching food.   Hyperlipidemia: Lipitor 20 mg daily.no new myalgias/side effects.  Lab Results  Component Value Date   CHOL 181 05/14/2022   HDL 57.20 05/14/2022   LDLCALC 108 (H) 05/14/2022   TRIG 77.0 05/14/2022   CHOLHDL 3 05/14/2022   Lab Results  Component Value Date   ALT 22 05/14/2022   AST 24 05/14/2022   ALKPHOS 57 05/14/2022   BILITOT 0.6 05/14/2022   GERD Protonix 40 mg daily.  Working well for heartburn - takes in afternoon., daily use. Watching triggers.   Mild persistent asthma Managed with wixela 250/50,  albuterol as needed - only needed at start of allergy season. .  Prediabetes: Diet/exercise approach - weight loss with activity/diet.  Body mass index is 30.32 kg/m. Lab Results  Component Value Date   HGBA1C 6.2 06/12/2021   Wt Readings from Last 3 Encounters:  09/12/22 199 lb 6.4 oz (90.4 kg)  05/14/22 209 lb 9.6 oz (95.1 kg)  12/15/21 201 lb 3.2 oz (91.3 kg)         09/12/2022    2:01 PM 05/14/2022   10:48 AM 11/16/2021    9:13 AM 12/09/2020   10:59 AM 12/21/2019    3:52 PM  Depression screen PHQ 2/9  Decreased Interest  0 1 0 0  Down, Depressed, Hopeless 0 0 1 0 0  PHQ - 2 Score 0 0 2 0 0  Altered sleeping 0 0 1    Tired, decreased energy 0 1 2    Change in appetite 0 0 2    Feeling bad or failure about yourself  0 0 0    Trouble concentrating 0 0 0    Moving slowly or fidgety/restless 0 0 0    Suicidal thoughts 0 0 0    PHQ-9 Score 0 1 7    Difficult doing work/chores Not difficult at all       Health Maintenance  Topic Date Due   Zoster Vaccines- Shingrix (2 of 2) 04/14/2020   Medicare Annual Wellness (AWV)  06/12/2022    INFLUENZA VACCINE  12/06/2022   COLONOSCOPY (Pts 45-33yrs Insurance coverage will need to be confirmed)  05/28/2024   DTaP/Tdap/Td (2 - Td or Tdap) 04/24/2027   Pneumonia Vaccine 62+ Years old  Completed   COVID-19 Vaccine  Completed   Hepatitis C Screening  Completed   HPV VACCINES  Aged Out  Colonoscopy 05/29/2019, repeat 5 years.  Prostate: does not have family history of prostate cancer The natural history of prostate cancer and ongoing controversy regarding screening and potential treatment outcomes of prostate cancer has been discussed with the patient. The meaning of a false positive PSA and a false negative PSA has been discussed. He indicates understanding of the limitations of this screening test and wishes NOT to proceed with screening PSA testing. Defers to next year.  Lab Results  Component Value Date   PSA1 1.0 07/11/2017   PSA 1.40 06/12/2021   PSA 0.8 12/15/2015    Immunization History  Administered Date(s) Administered   Influenza Split 01/29/2012   Influenza,inj,Quad PF,6+ Mos 04/23/2017  Moderna SARS-COV2 Booster Vaccination 08/18/2020   Moderna Sars-Covid-2 Vaccination 07/29/2019, 08/26/2019   PNEUMOCOCCAL CONJUGATE-20 11/16/2021   Pfizer Covid-19 Vaccine Bivalent Booster 53yrs & up 01/26/2021   Tdap 04/23/2017  Shingrix: at pharmacy - 2nd one due soon.  RSV vaccine - may have received at his pharmacy.  COVID booster and flu vaccine recommended in the fall.  No results found. Appt in next few months. Vision doing ok. Uses glasses for distance and has readers.   Dental: every 6 months  Alcohol: on average one per week - sometimes a few per week.   Tobacco: none.   Exercise: 1.56mile walk per day, planning on yoga.    History Patient Active Problem List   Diagnosis Date Noted   Asthma 01/29/2012   Allergic rhinitis 01/29/2012   Past Medical History:  Diagnosis Date   Allergy    Asthma    Depression    minor- resolved per pt    GERD  (gastroesophageal reflux disease)    occasionally with diet changes    History of blood transfusion    1990's   Hyperlipidemia    Past Surgical History:  Procedure Laterality Date   COLONOSCOPY     FRACTURE SURGERY     HERNIA REPAIR     PILONIDAL CYST EXCISION  1980   POLYPECTOMY     VASECTOMY     Allergies  Allergen Reactions   Gentamycin [Gentamicin] Other (See Comments)    Eye drop   Prior to Admission medications   Medication Sig Start Date End Date Taking? Authorizing Provider  atorvastatin (LIPITOR) 20 MG tablet Take 1 tablet (20 mg total) by mouth daily. 05/14/22  Yes Shade Flood, MD  fluticasone-salmeterol [facility] INHUB) 250-50 MCG/ACT AEPB Inhale 1 puff into the lungs in the morning and at bedtime.   Yes [provider]  Multiple Vitamin (MULTIVITAMIN) tablet Take 1 tablet by mouth daily. Over 50+ daily   Yes [provider]  pantoprazole (PROTONIX) 40 MG tablet TAKE 1 TABLET BY MOUTH EVERY DAY 09/12/22  Yes Shade Flood, MD  albuterol (VENTOLIN HFA) 108 (90 Base) MCG/ACT inhaler INHALE 1 TO 2 PUFFS INTO THE LUNGS EVERY 6 HOURS AS NEEDED Patient not taking: Reported on 09/12/2022 07/16/22   Shade Flood, MD  fluticasone-salmeterol (ADVAIR DISKUS) 250-50 MCG/ACT AEPB Inhale 1 puff into the lungs in the morning and at bedtime. Patient not taking: Reported on 09/12/2022 05/14/22   Shade Flood, MD   Social History   Socioeconomic History   Marital status: Married    Spouse name: Not on file   Number of children: Not on file   Years of education: Not on file   Highest education level: Not on file  Occupational History   Not on file  Tobacco Use   Smoking status: Former    Types: Cigarettes   Smokeless tobacco: Never   Tobacco comments:    quit at age 27  Substance and Sexual Activity   Alcohol use: Yes    Comment: occ   Drug use: No   Sexual activity: Not Currently  Other Topics Concern   Not on file  Social History Narrative    Marital status: married      Children: 2 children; +grandchildren 4      Lives:  With wife.      Employment:  Working; Engineer, agricultural businesses      Tobacco: quit age 41      Alcohol: socially  Exercise: walks dogs three times per day; [place] farm. 1.5 miles per day.  Training on the farm on weekends.   Social Determinants of Health   Financial Resource Strain: Not on file  Food Insecurity: Not on file  Transportation Needs: Not on file  Physical Activity: Not on file  Stress: Not on file  Social Connections: Not on file  Intimate Partner Violence: Not on file    Review of Systems  13 point review of systems per patient health survey noted.  Negative other than as indicated above or in HPI.   Objective:   Vitals:   09/12/22 1404  BP: 104/62  Pulse: 95  Temp: 98.9 F (37.2 C)  TempSrc: Temporal  SpO2: 94%  Weight: 199 lb 6.4 oz (90.4 kg)  Height: 5\' 8"  (1.727 m)     Physical Exam Vitals reviewed.  Constitutional:      Appearance: He is well-developed.  HENT:     Head: Normocephalic and atraumatic.     Right Ear: External ear normal.     Left Ear: External ear normal.  Eyes:     Conjunctiva/sclera: Conjunctivae normal.     Pupils: Pupils are equal, round, and reactive to light.  Neck:     Thyroid: No thyromegaly.  Cardiovascular:     Rate and Rhythm: Normal rate and regular rhythm.     Heart sounds: Normal heart sounds.  Pulmonary:     Effort: Pulmonary effort is normal. No respiratory distress.     Breath sounds: Normal breath sounds. No wheezing.  Abdominal:     General: There is no distension.     Palpations: Abdomen is soft.     Tenderness: There is no abdominal tenderness.  Musculoskeletal:        General: No tenderness. Normal range of motion.     Cervical back: Normal range of motion and neck supple.  Lymphadenopathy:     Cervical: No cervical adenopathy.  Skin:    General: Skin is warm and dry.  Neurological:     Mental Status: He is alert and  oriented to person, place, and time.     Deep Tendon Reflexes: Reflexes are normal and symmetric.  Psychiatric:        Behavior: Behavior normal.     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 Annual physical exam  - -anticipatory guidance as below in AVS, screening labs above. Health maintenance items as above in HPI discussed/recommended as applicable.   Hyperlipidemia, unspecified hyperlipidemia type - Plan: Lipid panel, atorvastatin (LIPITOR) 20 MG tablet  -Tolerating current regimen, continue same  Screening for diabetes mellitus Prediabetes - Plan: Hemoglobin A1c  -Commended on weight loss with diet changes, check updated A1c.  Meds ordered this encounter  Medications   atorvastatin (LIPITOR) 20 MG tablet    Sig: Take 1 tablet (20 mg total) by mouth daily.    Dispense:  90 tablet    Refill:  1   Patient Instructions  Glad to hear you are doing well, congratulations on the weight loss.  Keep up the good work.  If you have not had the RSV vaccine to your pharmacy it is what I would recommend.  If any concerns on your labs I will let you know.  Follow-up in 6 months.  Preventive Care 58 Years and Older, Male Preventive care refers to lifestyle choices and visits with your health care provider that can promote health and wellness. Preventive care visits are also  called wellness exams. What can I expect for my preventive care visit? Counseling During your preventive care visit, your health care provider may ask about your: Medical history, including: Past medical problems. Family medical history. History of falls. Current health, including: Emotional well-being. Home life and relationship well-being. Sexual activity. Memory and ability to understand (cognition). Lifestyle, including: Alcohol, nicotine or tobacco, and drug use. Access to firearms. Diet, exercise, and sleep habits. Work and work Astronomer. Sunscreen use. Safety issues such as seatbelt and  bike helmet use. Physical exam Your health care provider will check your: Height and weight. These may be used to calculate your BMI (body mass index). BMI is a measurement that tells if you are at a healthy weight. Waist circumference. This measures the distance around your waistline. This measurement also tells if you are at a healthy weight and may help predict your risk of certain diseases, such as type 2 diabetes and high blood pressure. Heart rate and blood pressure. Body temperature. Skin for abnormal spots. What immunizations do I need?  Vaccines are usually given at various ages, according to a schedule. Your health care provider will recommend vaccines for you based on your age, medical history, and lifestyle or other factors, such as travel or where you work. What tests do I need? Screening Your health care provider may recommend screening tests for certain conditions. This may include: Lipid and cholesterol levels. Diabetes screening. This is done by checking your blood sugar (glucose) after you have not eaten for a while (fasting). Hepatitis C test. Hepatitis B test. HIV (human immunodeficiency virus) test. STI (sexually transmitted infection) testing, if you are at risk. Lung cancer screening. Colorectal cancer screening. Prostate cancer screening. Abdominal aortic aneurysm (AAA) screening. You may need this if you are a current or former smoker. Talk with your health care provider about your test results, treatment options, and if necessary, the need for more tests. Follow these instructions at home: Eating and drinking  Eat a diet that includes fresh fruits and vegetables, whole grains, lean protein, and low-fat dairy products. Limit your intake of foods with high amounts of sugar, saturated fats, and salt. Take vitamin and mineral supplements as recommended by your health care provider. Do not drink alcohol if your health care provider tells you not to drink. If you  drink alcohol: Limit how much you have to 0-2 drinks a day. Know how much alcohol is in your drink. In the [place] one drink equals one 12 oz bottle of beer (355 mL), one 5 oz glass of wine (148 mL), or one 1 oz glass of hard liquor (44 mL). Lifestyle Brush your teeth every morning and night with fluoride toothpaste. Floss one time each day. Exercise for at least 30 minutes 5 or more days each week. Do not use any products that contain nicotine or tobacco. These products include cigarettes, chewing tobacco, and vaping devices, such as e-cigarettes. If you need help quitting, ask your health care provider. Do not use drugs. If you are sexually active, practice safe sex. Use a condom or other form of protection to prevent STIs. Take aspirin only as told by your health care provider. Make sure that you understand how much to take and what form to take. Work with your health care provider to find out whether it is safe and beneficial for you to take aspirin daily. Ask your health care provider if you need to take a cholesterol-lowering medicine (statin). Find healthy ways to manage stress, such as:  Meditation, yoga, or listening to music. Journaling. Talking to a trusted person. Spending time with friends and family. Safety Always wear your seat belt while driving or riding in a vehicle. Do not drive: If you have been drinking alcohol. Do not ride with someone who has been drinking. When you are tired or distracted. While texting. If you have been using any mind-altering substances or drugs. Wear a helmet and other protective equipment during sports activities. If you have firearms in your house, make sure you follow all gun safety procedures. Minimize exposure to UV radiation to reduce your risk of skin cancer. What's next? Visit your health care provider once a year for an annual wellness visit. Ask your health care provider how often you should have your eyes and teeth checked. Stay up to  date on all vaccines. This information is not intended to replace advice given to you by your health care provider. Make sure you discuss any questions you have with your health care provider. Document Revised: 10/19/2020 Document Reviewed: 10/19/2020 Elsevier Patient Education  2023 [employer]     Signed,   Meredith Staggers, MD [facility], [facility] 09/12/22 2:26 PM

## 2022-09-13 LAB — HEMOGLOBIN A1C: Hgb A1c MFr Bld: 6.2 % (ref 4.6–6.5)

## 2022-09-13 LAB — LIPID PANEL
Cholesterol: 158 mg/dL (ref 0–200)
HDL: 52.7 mg/dL (ref >39.00)
LDL Cholesterol: 93 mg/dL (ref 0–99)
NonHDL: 105.18
Total CHOL/HDL Ratio: 3
Triglycerides: 63 mg/dL (ref 0.0–149.0)
VLDL: 12.6 mg/dL (ref 0.0–40.0)

## 2022-09-19 ENCOUNTER — Telehealth: Payer: Self-pay | Admitting: Family Medicine

## 2022-09-19 NOTE — Telephone Encounter (Signed)
Contacted Blake Nash to schedule their annual wellness visit. Patient declined to schedule AWV at this time.  Patient feels he discusses everything with Dr. Neva Seat and AWV isn't necessary. I was unable to convince patient otherwise.  Thank you,  [facility] Direct dial  [phone]

## 2022-11-01 DIAGNOSIS — H02834 Dermatochalasis of left upper eyelid: Secondary | ICD-10-CM | POA: Diagnosis not present

## 2022-11-01 DIAGNOSIS — H2513 Age-related nuclear cataract, bilateral: Secondary | ICD-10-CM | POA: Diagnosis not present

## 2022-11-01 DIAGNOSIS — H02831 Dermatochalasis of right upper eyelid: Secondary | ICD-10-CM | POA: Diagnosis not present

## 2022-11-12 ENCOUNTER — Encounter: Payer: Medicare Other | Admitting: Family Medicine

## 2022-11-28 ENCOUNTER — Encounter: Payer: Medicare Other | Admitting: Family Medicine

## 2022-12-18 ENCOUNTER — Other Ambulatory Visit: Payer: Self-pay | Admitting: Family Medicine

## 2022-12-18 DIAGNOSIS — J4530 Mild persistent asthma, uncomplicated: Secondary | ICD-10-CM

## 2022-12-18 NOTE — Telephone Encounter (Signed)
Pharmacy sent a refill request for the [facility] inhaler. I was not able to reach pt to be sure he is taking this medication and looking it states a historical provider sent in the original RX . Would you like to refill ?

## 2022-12-18 NOTE — Telephone Encounter (Signed)
Medication discussed in May, stable at that time, refilled.

## 2022-12-25 ENCOUNTER — Other Ambulatory Visit: Payer: Self-pay | Admitting: *Deleted

## 2022-12-25 ENCOUNTER — Encounter: Payer: Self-pay | Admitting: *Deleted

## 2022-12-25 DIAGNOSIS — R911 Solitary pulmonary nodule: Secondary | ICD-10-CM

## 2023-01-11 ENCOUNTER — Ambulatory Visit
Admission: RE | Admit: 2023-01-11 | Discharge: 2023-01-11 | Disposition: A | Discharge status: Home / Self Care | Payer: Medicare Other | Source: Ambulatory Visit | Attending: Cardiology | Admitting: Cardiology

## 2023-01-11 DIAGNOSIS — J984 Other disorders of lung: Secondary | ICD-10-CM | POA: Diagnosis not present

## 2023-01-11 DIAGNOSIS — R911 Solitary pulmonary nodule: Secondary | ICD-10-CM

## 2023-01-11 DIAGNOSIS — I700 Atherosclerosis of aorta: Secondary | ICD-10-CM | POA: Diagnosis not present

## 2023-01-11 DIAGNOSIS — J9811 Atelectasis: Secondary | ICD-10-CM | POA: Diagnosis not present

## 2023-01-12 ENCOUNTER — Other Ambulatory Visit: Payer: Self-pay | Admitting: Family Medicine

## 2023-01-12 DIAGNOSIS — R12 Heartburn: Secondary | ICD-10-CM

## 2023-01-22 ENCOUNTER — Telehealth: Payer: Self-pay | Admitting: Cardiology

## 2023-01-22 NOTE — Telephone Encounter (Signed)
Spoke with patient he is aware of CT results. He verbalized understanding

## 2023-01-22 NOTE — Telephone Encounter (Signed)
Patient called to follow-up on CT Chest Wo Contrast test results.

## 2023-03-14 ENCOUNTER — Ambulatory Visit: Payer: Medicare Other | Admitting: Family Medicine

## 2023-03-21 ENCOUNTER — Ambulatory Visit: Payer: Medicare Other | Admitting: Family Medicine

## 2023-03-21 VITALS — BP 126/74 | HR 74 | Temp 98.4°F | Ht 68.0 in | Wt 203.6 lb

## 2023-03-21 DIAGNOSIS — R7303 Prediabetes: Secondary | ICD-10-CM

## 2023-03-21 DIAGNOSIS — H9319 Tinnitus, unspecified ear: Secondary | ICD-10-CM

## 2023-03-21 DIAGNOSIS — R12 Heartburn: Secondary | ICD-10-CM

## 2023-03-21 DIAGNOSIS — M79674 Pain in right toe(s): Secondary | ICD-10-CM

## 2023-03-21 DIAGNOSIS — E785 Hyperlipidemia, unspecified: Secondary | ICD-10-CM

## 2023-03-21 DIAGNOSIS — J4530 Mild persistent asthma, uncomplicated: Secondary | ICD-10-CM

## 2023-03-21 LAB — COMPREHENSIVE METABOLIC PANEL
ALT: 24 U/L (ref 0–53)
AST: 24 U/L (ref 0–37)
Albumin: 4.3 g/dL (ref 3.5–5.2)
Alkaline Phosphatase: 74 U/L (ref 39–117)
BUN: 21 mg/dL (ref 6–23)
CO2: 30 meq/L (ref 19–32)
Calcium: 9.6 mg/dL (ref 8.4–10.5)
Chloride: 104 meq/L (ref 96–112)
Creatinine, Ser: 1.11 mg/dL (ref 0.40–1.50)
GFR: 68.94 mL/min (ref >60.00)
Glucose, Bld: 93 mg/dL (ref 70–99)
Potassium: 4.8 meq/L (ref 3.5–5.1)
Sodium: 138 meq/L (ref 135–145)
Total Bilirubin: 0.4 mg/dL (ref 0.2–1.2)
Total Protein: 7.3 g/dL (ref 6.0–8.3)

## 2023-03-21 LAB — LIPID PANEL
Cholesterol: 164 mg/dL (ref 0–200)
HDL: 46.8 mg/dL (ref >39.00)
LDL Cholesterol: 100 mg/dL — ABNORMAL HIGH (ref 0–99)
NonHDL: 117.64
Total CHOL/HDL Ratio: 4
Triglycerides: 90 mg/dL (ref 0.0–149.0)
VLDL: 18 mg/dL (ref 0.0–40.0)

## 2023-03-21 NOTE — Progress Notes (Signed)
Subjective:  Patient ID: Blake Nash, male    DOB: [birth year]  Age: 67 y.o. MRN: [id number]  CC:  Chief Complaint  Patient presents with   Medical Management of Chronic Issues    Pt notes some foot pain Rt inner aspect near big toe,  intermittent over the last few months wants to know how to treat this    Tinnitus    Pt notes has had several years where he has a constant buzzing sound, notes more constant now, used to work with heavy machinery     HPI Blake Nash presents for   Concerns as above  Right foot pain: Intermittent past few months. R great toe. Off and on past few years. Prior injury to that toe 10 years ago. No surgery. Jammed toe. No redness or swelling. Notices more at times.  Tx: voltaren gel few times - possible improvement. No hx of gout.   Tinnitus: Past few years.  Prior noise exposure with use of machinery.  Constant buzzing sound, more constant recently- past year.  Some frequency loss on prior audiology eval earlier this year. No hearing aids. No pulsatile tinnitus.   Asthma Mild intermittent treated with Wixela daily - overall working well. albuterol as needed - few times in past month. Usually not needed.   GERD Treated with Protonix 40 mg daily - working well.   Hyperlipidemia: Lipitor 20 mg daily.  No new myalgias/side effects. Lab Results  Component Value Date   CHOL 158 09/12/2022   HDL 52.70 09/12/2022   LDLCALC 93 09/12/2022   TRIG 63.0 09/12/2022   CHOLHDL 3 09/12/2022   Lab Results  Component Value Date   ALT 22 05/14/2022   AST 24 05/14/2022   ALKPHOS 57 05/14/2022   BILITOT 0.6 05/14/2022   Prediabetes: Weight has increased slightly.  Diet/exercise approach. Started going to senior center, some biking, [employer].  No change in thirst/vision/urination.   Lab Results  Component Value Date   HGBA1C 6.2 09/12/2022   Wt Readings from Last 3 Encounters:  03/21/23 203 lb 9.6 oz (92.4 kg)  09/12/22 199 lb 6.4 oz (90.4 kg)   05/14/22 209 lb 9.6 oz (95.1 kg)    History Patient Active Problem List   Diagnosis Date Noted   Asthma 01/29/2012   Allergic rhinitis 01/29/2012   Past Medical History:  Diagnosis Date   Allergy    Asthma    Depression    minor- resolved per pt    GERD (gastroesophageal reflux disease)    occasionally with diet changes    History of blood transfusion    1990's   Hyperlipidemia    Past Surgical History:  Procedure Laterality Date   COLONOSCOPY     FRACTURE SURGERY     HERNIA REPAIR     PILONIDAL CYST EXCISION  1980   POLYPECTOMY     VASECTOMY     Allergies  Allergen Reactions   Gentamycin [Gentamicin] Other (See Comments)    Eye drop   Prior to Admission medications   Medication Sig Start Date End Date Taking? Authorizing Provider  albuterol (VENTOLIN HFA) 108 (90 Base) MCG/ACT inhaler INHALE 1 TO 2 PUFFS INTO THE LUNGS EVERY 6 HOURS AS NEEDED Patient not taking: Reported on 09/12/2022 07/16/22   Shade Flood, MD  atorvastatin (LIPITOR) 20 MG tablet Take 1 tablet (20 mg total) by mouth daily. 09/12/22   Shade Flood, MD  Multiple Vitamin (MULTIVITAMIN) tablet Take 1 tablet by mouth daily. Over  50+ daily    [provider]  pantoprazole (PROTONIX) 40 MG tablet TAKE 1 TABLET BY MOUTH EVERY DAY 01/14/23   Shade Flood, MD  Monte Fantasia INHUB 250-50 MCG/ACT AEPB INHALE 1 PUFF INTO THE LUNGS IN THE MORNING AND AT BEDTIME. 12/18/22   Shade Flood, MD   Social History   Socioeconomic History   Marital status: Married    Spouse name: Not on file   Number of children: Not on file   Years of education: Not on file   Highest education level: Not on file  Occupational History   Not on file  Tobacco Use   Smoking status: Former    Types: Cigarettes   Smokeless tobacco: Never   Tobacco comments:    quit at age 74  Substance and Sexual Activity   Alcohol use: Yes    Comment: occ   Drug use: No   Sexual activity: Not Currently  Other Topics Concern    Not on file  Social History Narrative   Marital status: married      Children: 2 children; +grandchildren 4      Lives:  With wife.      Employment:  Working; Engineer, agricultural businesses      Tobacco: quit age 40      Alcohol: socially      Exercise: walks dogs three times per day; [place]. 1.5 miles per day.  Training on the farm on weekends.   Social Determinants of Health   Financial Resource Strain: Not on file  Food Insecurity: Not on file  Transportation Needs: Not on file  Physical Activity: Not on file  Stress: Not on file  Social Connections: Not on file  Intimate Partner Violence: Not on file    Review of Systems Per HPI   Objective:   Vitals:   03/21/23 1326  BP: 126/74  Pulse: 74  Temp: 98.4 F (36.9 C)  TempSrc: Temporal  SpO2: 96%  Weight: 203 lb 9.6 oz (92.4 kg)  Height: 5\' 8"  (1.727 m)     Physical Exam Vitals reviewed.  Constitutional:      Appearance: He is well-developed.  HENT:     Head: Normocephalic and atraumatic.     Ears:     Comments: Canals clear,  dull TMs bilaterally. Neck:     Vascular: No carotid bruit or JVD.  Cardiovascular:     Rate and Rhythm: Normal rate and regular rhythm.     Heart sounds: Normal heart sounds. No murmur heard. Pulmonary:     Effort: Pulmonary effort is normal.     Breath sounds: Normal breath sounds. No rales.  Musculoskeletal:     Right lower leg: No edema.     Left lower leg: No edema.     Comments: Right foot, prominence of MTP joint, area of previous discomfort, nontender with motion.  No appreciable soft tissue swelling or erythema.  No warmth.  Skin:    General: Skin is warm and dry.  Neurological:     Mental Status: He is alert and oriented to person, place, and time.  Psychiatric:        Mood and Affect: Mood normal.        Assessment & Plan:  Blake Nash is a 67 y.o. male . Hyperlipidemia, unspecified hyperlipidemia type - Plan: Comprehensive metabolic panel, Lipid panel  -Tolerating  Lipitor.  Continue same dose, check labs and adjust plan accordingly.  Prediabetes  -Continue diet/exercise approach.   Mild persistent  asthma without complication  -Stable with current regimen, continue same with RTC precautions.  Great toe pain, right - Plan: DG Foot Complete Right  -Possible arthritic changes versus prior injury, check imaging.  Topical Voltaren gel is fine for now.  Tinnitus, unspecified laterality  -Likely related to prior noise exposure.  Handout given, masking sounds discussed, may or may not have some benefit with over-the-counter riboflavin.  Option of ENT or audiology eval if persistent.  Heartburn  -Continue Protonix.  Stable control.  No orders of the defined types were placed in this encounter.  Patient Instructions  Tinnitus likely from prior noise exposure. See info below, masking sounds, riboflavin as an option. If not improved, I can refer you to ENT. Let me know.   Xray of foot at location below. Possible arthritis. Voltaren gel for now is fine if that helps.  [facility] or xray: Walk in 8:30-4:30 during weekdays, no appointment needed [address]  [place], [place] [address]   Tinnitus Tinnitus is when you hear a sound that there's no actual source for. It may sound like ringing in your ears or something else. The sound may be loud, soft, or somewhere in between. It can last for a few seconds or be constant for days. It can come and go. Almost everyone has tinnitus at some point. It's not the same as hearing loss. But you may need to see a health care provider if: It lasts for a long time. It comes back often. You have trouble sleeping and focusing. What are the causes? The cause of tinnitus is often unknown. In some cases, you may get it if: You're around loud noises, such as from machines or music. An object gets stuck in your ear. Earwax builds up in Landscape architect. You drink a lot of alcohol or caffeine. You take certain medicines. You  start to lose your hearing. You may also get it from some medical conditions. These may include: Ear or sinus infections. Heart diseases. High blood pressure. Allergies. Mnire's disease. Problems with your thyroid. A tumor. This is a growth of cells that isn't normal. A weak, bulging blood vessel called an aneurysm near your ear. What increases the risk? You may be more likely to get tinnitus if: You're around loud noises a lot. You're older. You drink alcohol. You smoke. What are the signs or symptoms? The main symptom is hearing a sound that there's no source for. It may sound like ringing. It may also sound like: Buzzing. Sizzling. Blowing air. Hissing. Whistling. Other sounds may include: Roaring. Running water. A musical note. Tapping. Humming. You may have symptoms in one ear or both ears. How is this diagnosed? Tinnitus is diagnosed based on your symptoms, your medical history, and an exam. Your provider may do a full hearing test if your tinnitus: Is in just one ear. Makes it hard for you to hear. Lasts 6 months or longer. You may also need to see an expert in hearing disorders called an audiologist. They may ask you about your symptoms and how tinnitus affects your daily life. You may have other tests done. These may include: A CT scan. An MRI. An angiogram. This shows how blood flows through your blood vessels. How is this treated? Treatment may include: Therapy to help you manage the stress of living with tinnitus. Finding ways to mask or cover the sound of tinnitus. These include: Sound or white noise machines. Devices that fit in your ear and play sounds or  music. A loud humidifier. Acoustic neural stimulation. This is when you use headphones to listen to music that has a special signal in it. Over time, this signal may change some of the pathways in your brain. This can make you less sensitive to tinnitus. This treatment is used for very severe  cases. Using hearing aids or cochlear implants if your tinnitus is from hearing loss. If your tinnitus is caused by a medical condition, treating the condition may make it go away.  Follow these instructions at home: Managing symptoms     Try to avoid being in loud places or around loud noises. Wear earplugs or headphones when you're around loud noises. Find ways to reduce stress. These may include meditation, yoga, or deep breathing. Sleep with your head slightly raised. General instructions Take over-the-counter and prescription medicines only as told by your provider. Track the things that cause symptoms (triggers). Try to avoid these things. To stop your tinnitus from getting worse: Do not drink alcohol. Do not have caffeine. Do not use any products that contain nicotine or tobacco. These products include cigarettes, chewing tobacco, and vaping devices, such as e-cigarettes. If you need help quitting, ask your provider. Avoid using too much salt. Get enough sleep each night. Where to find more information American Tinnitus Association: https://www.johnson-hamilton.org/ Contact a health care provider if: Your symptoms last for 3 weeks or longer without stopping. You have sudden hearing loss. Your symptoms get worse or don't get better with home care. You can't manage the stress of living with tinnitus. Get help right away if: You get tinnitus after a head injury. You have tinnitus and: Dizziness. Nausea and vomiting. Loss of balance. A sudden, severe headache. Changes to your eyesight. Weakness in your face, arms, or legs. These symptoms may be an emergency. Get help right away. Call 911. Do not wait to see if the symptoms will go away. Do not drive yourself to the hospital. This information is not intended to replace advice given to you by your health care provider. Make sure you discuss any questions you have with your health care provider. Document Revised: 07/30/2022 Document Reviewed:  07/30/2022 Elsevier Patient Education  2024 Elsevier Inc.      Signed,   Meredith Staggers, MD [facility], [facility] 03/21/23 1:47 PM

## 2023-03-21 NOTE — Patient Instructions (Addendum)
Tinnitus likely from prior noise exposure. See info below, masking sounds, riboflavin as an option. If not improved, I can refer you to ENT. Let me know.   Xray of foot at location below. Possible arthritis. Voltaren gel for now is fine if that helps.  [facility] or xray: Walk in 8:30-4:30 during weekdays, no appointment needed [address]  [place], [place] [address]   Tinnitus Tinnitus is when you hear a sound that there's no actual source for. It may sound like ringing in your ears or something else. The sound may be loud, soft, or somewhere in between. It can last for a few seconds or be constant for days. It can come and go. Almost everyone has tinnitus at some point. It's not the same as hearing loss. But you may need to see a health care provider if: It lasts for a long time. It comes back often. You have trouble sleeping and focusing. What are the causes? The cause of tinnitus is often unknown. In some cases, you may get it if: You're around loud noises, such as from machines or music. An object gets stuck in your ear. Earwax builds up in Landscape architect. You drink a lot of alcohol or caffeine. You take certain medicines. You start to lose your hearing. You may also get it from some medical conditions. These may include: Ear or sinus infections. Heart diseases. High blood pressure. Allergies. Mnire's disease. Problems with your thyroid. A tumor. This is a growth of cells that isn't normal. A weak, bulging blood vessel called an aneurysm near your ear. What increases the risk? You may be more likely to get tinnitus if: You're around loud noises a lot. You're older. You drink alcohol. You smoke. What are the signs or symptoms? The main symptom is hearing a sound that there's no source for. It may sound like ringing. It may also sound like: Buzzing. Sizzling. Blowing air. Hissing. Whistling. Other sounds may include: Roaring. Running water. A musical  note. Tapping. Humming. You may have symptoms in one ear or both ears. How is this diagnosed? Tinnitus is diagnosed based on your symptoms, your medical history, and an exam. Your provider may do a full hearing test if your tinnitus: Is in just one ear. Makes it hard for you to hear. Lasts 6 months or longer. You may also need to see an expert in hearing disorders called an audiologist. They may ask you about your symptoms and how tinnitus affects your daily life. You may have other tests done. These may include: A CT scan. An MRI. An angiogram. This shows how blood flows through your blood vessels. How is this treated? Treatment may include: Therapy to help you manage the stress of living with tinnitus. Finding ways to mask or cover the sound of tinnitus. These include: Sound or white noise machines. Devices that fit in your ear and play sounds or music. A loud humidifier. Acoustic neural stimulation. This is when you use headphones to listen to music that has a special signal in it. Over time, this signal may change some of the pathways in your brain. This can make you less sensitive to tinnitus. This treatment is used for very severe cases. Using hearing aids or cochlear implants if your tinnitus is from hearing loss. If your tinnitus is caused by a medical condition, treating the condition may make it go away.  Follow these instructions at home: Managing symptoms     Try to avoid being in loud  places or around loud noises. Wear earplugs or headphones when you're around loud noises. Find ways to reduce stress. These may include meditation, yoga, or deep breathing. Sleep with your head slightly raised. General instructions Take over-the-counter and prescription medicines only as told by your provider. Track the things that cause symptoms (triggers). Try to avoid these things. To stop your tinnitus from getting worse: Do not drink alcohol. Do not have caffeine. Do not use any  products that contain nicotine or tobacco. These products include cigarettes, chewing tobacco, and vaping devices, such as e-cigarettes. If you need help quitting, ask your provider. Avoid using too much salt. Get enough sleep each night. Where to find more information American Tinnitus Association: https://www.johnson-hamilton.org/ Contact a health care provider if: Your symptoms last for 3 weeks or longer without stopping. You have sudden hearing loss. Your symptoms get worse or don't get better with home care. You can't manage the stress of living with tinnitus. Get help right away if: You get tinnitus after a head injury. You have tinnitus and: Dizziness. Nausea and vomiting. Loss of balance. A sudden, severe headache. Changes to your eyesight. Weakness in your face, arms, or legs. These symptoms may be an emergency. Get help right away. Call 911. Do not wait to see if the symptoms will go away. Do not drive yourself to the hospital. This information is not intended to replace advice given to you by your health care provider. Make sure you discuss any questions you have with your health care provider. Document Revised: 07/30/2022 Document Reviewed: 07/30/2022 Elsevier Patient Education  2024 [employer].

## 2023-03-24 ENCOUNTER — Encounter: Payer: Self-pay | Admitting: Family Medicine

## 2023-03-26 ENCOUNTER — Ambulatory Visit (INDEPENDENT_AMBULATORY_CARE_PROVIDER_SITE_OTHER)
Admission: RE | Admit: 2023-03-26 | Discharge: 2023-03-26 | Disposition: A | Discharge status: Home / Self Care | Payer: Medicare Other | Source: Ambulatory Visit | Attending: Family Medicine | Admitting: Family Medicine

## 2023-03-26 DIAGNOSIS — M79674 Pain in right toe(s): Secondary | ICD-10-CM

## 2023-03-26 DIAGNOSIS — M19071 Primary osteoarthritis, right ankle and foot: Secondary | ICD-10-CM | POA: Diagnosis not present

## 2023-03-26 DIAGNOSIS — G8929 Other chronic pain: Secondary | ICD-10-CM | POA: Diagnosis not present

## 2023-04-13 ENCOUNTER — Other Ambulatory Visit: Payer: Self-pay | Admitting: Family Medicine

## 2023-04-13 DIAGNOSIS — R12 Heartburn: Secondary | ICD-10-CM

## 2023-06-06 ENCOUNTER — Other Ambulatory Visit: Payer: Self-pay | Admitting: Family Medicine

## 2023-06-06 DIAGNOSIS — E785 Hyperlipidemia, unspecified: Secondary | ICD-10-CM

## 2023-06-10 ENCOUNTER — Other Ambulatory Visit: Payer: Self-pay | Admitting: Family Medicine

## 2023-06-10 DIAGNOSIS — J4530 Mild persistent asthma, uncomplicated: Secondary | ICD-10-CM

## 2023-09-18 ENCOUNTER — Ambulatory Visit (INDEPENDENT_AMBULATORY_CARE_PROVIDER_SITE_OTHER): Payer: Medicare Other | Admitting: Family Medicine

## 2023-09-18 VITALS — BP 126/74 | HR 85 | Temp 97.9°F | Ht 66.25 in | Wt 200.4 lb

## 2023-09-18 DIAGNOSIS — Z0000 Encounter for general adult medical examination without abnormal findings: Secondary | ICD-10-CM | POA: Diagnosis not present

## 2023-09-18 DIAGNOSIS — R12 Heartburn: Secondary | ICD-10-CM

## 2023-09-18 DIAGNOSIS — R7303 Prediabetes: Secondary | ICD-10-CM

## 2023-09-18 DIAGNOSIS — Z125 Encounter for screening for malignant neoplasm of prostate: Secondary | ICD-10-CM | POA: Diagnosis not present

## 2023-09-18 DIAGNOSIS — J4530 Mild persistent asthma, uncomplicated: Secondary | ICD-10-CM | POA: Diagnosis not present

## 2023-09-18 DIAGNOSIS — E785 Hyperlipidemia, unspecified: Secondary | ICD-10-CM

## 2023-09-18 MED ORDER — PANTOPRAZOLE SODIUM 40 MG PO TBEC: 40.0000 mg | DELAYED_RELEASE_TABLET | Freq: Every day | ORAL | 3 refills | Status: DC

## 2023-09-18 MED ORDER — FLUTICASONE-SALMETEROL 250-50 MCG/ACT IN AEPB: 1.0000 | INHALATION_SPRAY | Freq: Two times a day (BID) | RESPIRATORY_TRACT | 5 refills | Status: DC

## 2023-09-18 MED ORDER — ATORVASTATIN CALCIUM 20 MG PO TABS: 20.0000 mg | ORAL_TABLET | Freq: Every day | ORAL | 1 refills | Status: DC

## 2023-09-18 NOTE — Progress Notes (Signed)
 Subjective:  Patient ID: Blake Nash, male    DOB: Apr 26, 1956  Age: 68 y.o. MRN: [id number]  CC:  Chief Complaint  Patient presents with   Annual Exam    Pt is doing well, no concerns, pt is not fasting     HPI Blake Nash presents for Annual Exam  PCP, me Ophthalmology, Dr. Candi Chafe, appointment in June 2024 Cardiology, Dr. Lavonne Prairie.  Coronary CTA September 2023, minimal coronary plaque no further workup for CAD.  Lung nodule noted with repeat imaging in 1 year.  Repeat CT in September 2024, no change over 12 months, no further testing indicated.   Hyperlipidemia: Lipitor 20 mg daily. No new myalgias/side effects.  Lab Results  Component Value Date   CHOL 164 03/21/2023   HDL 46.80 03/21/2023   LDLCALC 100 (H) 03/21/2023   TRIG 90.0 03/21/2023   CHOLHDL 4 03/21/2023   Lab Results  Component Value Date   ALT 24 03/21/2023   AST 24 03/21/2023   ALKPHOS 74 03/21/2023   BILITOT 0.4 03/21/2023   GERD Protonix  40 mg daily as needed with certain foods. Pepcid nightly. Working well.   Mild persistent asthma Wixela 250/50 twice per day, albuterol  ordered previously, Worked well until pollen season - used some in past month. Not daily. Better recently - less albuterol  needed.   Prediabetes: Lab Results  Component Value Date   HGBA1C 6.2 09/12/2022   Wt Readings from Last 3 Encounters:  09/18/23 200 lb 6.4 oz (90.9 kg)  03/21/23 203 lb 9.6 oz (92.4 kg)  09/12/22 199 lb 6.4 oz (90.4 kg)     Denies persistent depressive symptoms. Wish had more energy at times.  11k steps per day.  Having to care for wife more in past year and a half.     09/18/2023    2:06 PM 09/12/2022    2:01 PM 05/14/2022   10:48 AM 11/16/2021    9:13 AM 12/09/2020   10:59 AM  Depression screen PHQ 2/9  Decreased Interest 1  0 1 0  Down, Depressed, Hopeless 0 0 0 1 0  PHQ - 2 Score 1 0 0 2 0  Altered sleeping 0 0 0 1   Tired, decreased energy 2 0 1 2   Change in appetite 1 0 0 2   Feeling bad  or failure about yourself  0 0 0 0   Trouble concentrating 0 0 0 0   Moving slowly or fidgety/restless 0 0 0 0   Suicidal thoughts 0 0 0 0   PHQ-9 Score 4 0 1 7   Difficult doing work/chores Somewhat difficult Not difficult at all      Health Maintenance  Topic Date Due   Zoster Vaccines- Shingrix (2 of 2) 04/14/2020   Medicare Annual Wellness (AWV)  06/12/2022   COVID-19 Vaccine (7 - 2024-25 season) 01/06/2023   INFLUENZA VACCINE  12/06/2023   Colonoscopy  05/28/2024   DTaP/Tdap/Td (2 - Td or Tdap) 04/24/2027   Pneumonia Vaccine 17+ Years old  Completed   Hepatitis C Screening  Completed   HPV VACCINES  Aged Out   Meningococcal B Vaccine  Aged Out  Colonoscopy 05/29/2019, Dr. Howard Macho, repeat 5 years. Prostate: does  have family history of prostate cancer The natural history of prostate cancer and ongoing controversy regarding screening and potential treatment outcomes of prostate cancer has been discussed with the patient. The meaning of a false positive PSA and a false negative PSA has been discussed. He  indicates understanding of the limitations of this screening test and wishes  to proceed with screening PSA testing.  Lab Results  Component Value Date   PSA1 1.0 07/11/2017   PSA 1.40 06/12/2021   PSA 0.8 12/15/2015    Immunization History  Administered Date(s) Administered   Fluad Quad(high Dose 65+) 03/23/2022   Influenza Split 01/29/2012   Influenza, High Dose Seasonal PF 02/17/2023   Influenza,inj,Quad PF,6+ Mos 04/23/2017, 05/03/2018, 02/18/2020   Influenza-Unspecified 12/22/2018, 02/18/2021   Moderna SARS-COV2 Booster Vaccination 08/18/2020   Moderna Sars-Covid-2 Vaccination 07/29/2019, 08/26/2019, 03/21/2020   PNEUMOCOCCAL CONJUGATE-20 11/16/2021   Pfizer Covid-19 Vaccine Bivalent Booster 50yrs & up 01/26/2021   Pfizer(Comirnaty)Fall Seasonal Vaccine 12 years and older 03/23/2022   Respiratory Syncytial Virus Vaccine,Recomb Aduvanted(Arexvy) 05/11/2022   Tdap  04/23/2017   Zoster Recombinant(Shingrix) 02/18/2020  Second shingrix at pharmacy planned.  Covid booster last fall.   No results found. Once per year at optho - as above.   Dental:every 6 months.   Alcohol:  wine, rare. up to 3 per month.   Tobacco: none  Exercise: walking 11k steps per day - no regular resistance, but some on farm. Has some hand weights at home    History Patient Active Problem List   Diagnosis Date Noted   Asthma 01/29/2012   Allergic rhinitis 01/29/2012   Past Medical History:  Diagnosis Date   Allergy    Asthma    Depression    minor- resolved per pt    GERD (gastroesophageal reflux disease)    occasionally with diet changes    History of blood transfusion    1990's   Hyperlipidemia    Past Surgical History:  Procedure Laterality Date   COLONOSCOPY     FRACTURE SURGERY     HERNIA REPAIR     PILONIDAL CYST EXCISION  1980   POLYPECTOMY     VASECTOMY     Allergies  Allergen Reactions   Gentamycin [Gentamicin] Other (See Comments)    Eye drop   Prior to Admission medications   Medication Sig Start Date End Date Taking? Authorizing Provider  atorvastatin  (LIPITOR) 20 MG tablet TAKE 1 TABLET BY MOUTH EVERY DAY 06/06/23  Yes Benjiman Bras, MD  Multiple Vitamin (MULTIVITAMIN) tablet Take 1 tablet by mouth daily. Over 50+ daily   Yes [provider]  pantoprazole  (PROTONIX ) 40 MG tablet TAKE 1 TABLET BY MOUTH EVERY DAY 04/15/23  Yes Benjiman Bras, MD  WIXELA INHUB 250-50 MCG/ACT AEPB INHALE 1 PUFF INTO THE LUNGS IN THE MORNING AND AT BEDTIME. 06/10/23  Yes Benjiman Bras, MD  albuterol  (VENTOLIN  HFA) 108 (90 Base) MCG/ACT inhaler INHALE 1 TO 2 PUFFS INTO THE LUNGS EVERY 6 HOURS AS NEEDED Patient not taking: Reported on 09/18/2023 07/16/22   Benjiman Bras, MD   Social History   Socioeconomic History   Marital status: Married    Spouse name: Not on file   Number of children: Not on file   Years of education: Not on file    Highest education level: Not on file  Occupational History   Not on file  Tobacco Use   Smoking status: Former    Types: Cigarettes   Smokeless tobacco: Never   Tobacco comments:    quit at age 37  Substance and Sexual Activity   Alcohol use: Yes    Comment: occ   Drug use: No   Sexual activity: Not Currently  Other Topics Concern   Not on file  Social History Narrative   Marital status: married      Children: 2 children; +grandchildren 4      Lives:  With wife.      Employment:  Working; Engineer, agricultural businesses      Tobacco: quit age 89      Alcohol: socially      Exercise: walks dogs three times per day; large farm. 1.5 miles per day.  Training on the farm on weekends.   Social Drivers of Corporate investment banker Strain: Not on file  Food Insecurity: Not on file  Transportation Needs: Not on file  Physical Activity: Not on file  Stress: Not on file  Social Connections: Not on file  Intimate Partner Violence: Not on file    Review of Systems 13 point review of systems per patient health survey noted.  Negative other than as indicated above or in HPI.    Objective:   Vitals:   09/18/23 1403  BP: 126/74  Pulse: 85  Temp: 97.9 F (36.6 C)  TempSrc: Temporal  SpO2: 95%  Weight: 200 lb 6.4 oz (90.9 kg)  Height: 5' 6.25" (1.683 m)     Physical Exam Vitals reviewed.  Constitutional:      Appearance: He is well-developed.  HENT:     Head: Normocephalic and atraumatic.     Right Ear: External ear normal.     Left Ear: External ear normal.  Eyes:     Conjunctiva/sclera: Conjunctivae normal.     Pupils: Pupils are equal, round, and reactive to light.  Neck:     Thyroid : No thyromegaly.  Cardiovascular:     Rate and Rhythm: Normal rate and regular rhythm.     Heart sounds: Normal heart sounds.  Pulmonary:     Effort: Pulmonary effort is normal. No respiratory distress.     Breath sounds: Normal breath sounds. No wheezing.  Abdominal:     General: There is  no distension.     Palpations: Abdomen is soft.     Tenderness: There is no abdominal tenderness.  Musculoskeletal:        General: No tenderness. Normal range of motion.     Cervical back: Normal range of motion and neck supple.  Lymphadenopathy:     Cervical: No cervical adenopathy.  Skin:    General: Skin is warm and dry.  Neurological:     Mental Status: He is alert and oriented to person, place, and time.     Deep Tendon Reflexes: Reflexes are normal and symmetric.  Psychiatric:        Behavior: Behavior normal.        Assessment & Plan:  ELAM ELLIS is a 68 y.o. male . Annual physical exam  - -anticipatory guidance as below in AVS, screening labs above. Health maintenance items as above in HPI discussed/recommended as applicable.   Prediabetes - Plan: Hemoglobin A1c  - No new meds, check A1c and adjust plan accordingly.  Hyperlipidemia, unspecified hyperlipidemia type - Plan: Comprehensive metabolic panel with GFR, Lipid panel, atorvastatin  (LIPITOR) 20 MG tablet  - Check labs, tolerating Lipitor, continue same.  Adjust plan accordingly based on results.  Mild persistent asthma without complication - Plan: fluticasone -salmeterol (WIXELA INHUB) 250-50 MCG/ACT AEPB  - Stable with Wixela, continue same.  Screening for prostate cancer - Plan: PSA   Heartburn - Plan: pantoprazole  (PROTONIX ) 40 MG tablet  - Stable with use of PPI, continue same.  Meds ordered this encounter  Medications   atorvastatin  (LIPITOR)  20 MG tablet    Sig: Take 1 tablet (20 mg total) by mouth daily.    Dispense:  90 tablet    Refill:  1   pantoprazole  (PROTONIX ) 40 MG tablet    Sig: Take 1 tablet (40 mg total) by mouth daily.    Dispense:  30 tablet    Refill:  3   fluticasone -salmeterol (WIXELA INHUB) 250-50 MCG/ACT AEPB    Sig: Inhale 1 puff into the lungs 2 (two) times daily. in the morning and at bedtime.    Dispense:  60 each    Refill:  5   Patient Instructions  Thanks for  coming in today.  If any lab concerns I will let you know.  Please follow up if you persistent mood symptoms.  No med changes for now. Ideally would like to see LDL below 70 if possible.  Take care!  Preventive Care 21 Years and Older, Male Preventive care refers to lifestyle choices and visits with your health care provider that can promote health and wellness. Preventive care visits are also called wellness exams. What can I expect for my preventive care visit? Counseling During your preventive care visit, your health care provider may ask about your: Medical history, including: Past medical problems. Family medical history. History of falls. Current health, including: Emotional well-being. Home life and relationship well-being. Sexual activity. Memory and ability to understand (cognition). Lifestyle, including: Alcohol, nicotine or tobacco, and drug use. Access to firearms. Diet, exercise, and sleep habits. Work and work Astronomer. Sunscreen use. Safety issues such as seatbelt and bike helmet use. Physical exam Your health care provider will check your: Height and weight. These may be used to calculate your BMI (body mass index). BMI is a measurement that tells if you are at a healthy weight. Waist circumference. This measures the distance around your waistline. This measurement also tells if you are at a healthy weight and may help predict your risk of certain diseases, such as type 2 diabetes and high blood pressure. Heart rate and blood pressure. Body temperature. Skin for abnormal spots. What immunizations do I need?  Vaccines are usually given at various ages, according to a schedule. Your health care provider will recommend vaccines for you based on your age, medical history, and lifestyle or other factors, such as travel or where you work. What tests do I need? Screening Your health care provider may recommend screening tests for certain conditions. This may  include: Lipid and cholesterol levels. Diabetes screening. This is done by checking your blood sugar (glucose) after you have not eaten for a while (fasting). Hepatitis C test. Hepatitis B test. HIV (human immunodeficiency virus) test. STI (sexually transmitted infection) testing, if you are at risk. Lung cancer screening. Colorectal cancer screening. Prostate cancer screening. Abdominal aortic aneurysm (AAA) screening. You may need this if you are a current or former smoker. Talk with your health care provider about your test results, treatment options, and if necessary, the need for more tests. Follow these instructions at home: Eating and drinking  Eat a diet that includes fresh fruits and vegetables, whole grains, lean protein, and low-fat dairy products. Limit your intake of foods with high amounts of sugar, saturated fats, and salt. Take vitamin and mineral supplements as recommended by your health care provider. Do not drink alcohol if your health care provider tells you not to drink. If you drink alcohol: Limit how much you have to 0-2 drinks a day. Know how much alcohol is in your  drink. In the [place] one drink equals one 12 oz bottle of beer (355 mL), one 5 oz glass of wine (148 mL), or one 1 oz glass of hard liquor (44 mL). Lifestyle Brush your teeth every morning and night with fluoride toothpaste. Floss one time each day. Exercise for at least 30 minutes 5 or more days each week. Do not use any products that contain nicotine or tobacco. These products include cigarettes, chewing tobacco, and vaping devices, such as e-cigarettes. If you need help quitting, ask your health care provider. Do not use drugs. If you are sexually active, practice safe sex. Use a condom or other form of protection to prevent STIs. Take aspirin only as told by your health care provider. Make sure that you understand how much to take and what form to take. Work with your health care provider to find out  whether it is safe and beneficial for you to take aspirin daily. Ask your health care provider if you need to take a cholesterol-lowering medicine (statin). Find healthy ways to manage stress, such as: Meditation, yoga, or listening to music. Journaling. Talking to a trusted person. Spending time with friends and family. Safety Always wear your seat belt while driving or riding in a vehicle. Do not drive: If you have been drinking alcohol. Do not ride with someone who has been drinking. When you are tired or distracted. While texting. If you have been using any mind-altering substances or drugs. Wear a helmet and other protective equipment during sports activities. If you have firearms in your house, make sure you follow all gun safety procedures. Minimize exposure to UV radiation to reduce your risk of skin cancer. What's next? Visit your health care provider once a year for an annual wellness visit. Ask your health care provider how often you should have your eyes and teeth checked. Stay up to date on all vaccines. This information is not intended to replace advice given to you by your health care provider. Make sure you discuss any questions you have with your health care provider. Document Revised: 10/19/2020 Document Reviewed: 10/19/2020 Elsevier Patient Education  2024 Else[employer] Inc.    Signed,   Caro Christmas, MD [facility], [facility] 09/18/23 2:37 PM

## 2023-09-18 NOTE — Patient Instructions (Addendum)
 Thanks for coming in today.  If any lab concerns I will let you know.  Please follow up if you persistent mood symptoms.  No med changes for now. Ideally would like to see LDL below 70 if possible.  Take care!  Preventive Care 55 Years and Older, Male Preventive care refers to lifestyle choices and visits with your health care provider that can promote health and wellness. Preventive care visits are also called wellness exams. What can I expect for my preventive care visit? Counseling During your preventive care visit, your health care provider may ask about your: Medical history, including: Past medical problems. Family medical history. History of falls. Current health, including: Emotional well-being. Home life and relationship well-being. Sexual activity. Memory and ability to understand (cognition). Lifestyle, including: Alcohol, nicotine or tobacco, and drug use. Access to firearms. Diet, exercise, and sleep habits. Work and work Astronomer. Sunscreen use. Safety issues such as seatbelt and bike helmet use. Physical exam Your health care provider will check your: Height and weight. These may be used to calculate your BMI (body mass index). BMI is a measurement that tells if you are at a healthy weight. Waist circumference. This measures the distance around your waistline. This measurement also tells if you are at a healthy weight and may help predict your risk of certain diseases, such as type 2 diabetes and high blood pressure. Heart rate and blood pressure. Body temperature. Skin for abnormal spots. What immunizations do I need?  Vaccines are usually given at various ages, according to a schedule. Your health care provider will recommend vaccines for you based on your age, medical history, and lifestyle or other factors, such as travel or where you work. What tests do I need? Screening Your health care provider may recommend screening tests for certain conditions. This  may include: Lipid and cholesterol levels. Diabetes screening. This is done by checking your blood sugar (glucose) after you have not eaten for a while (fasting). Hepatitis C test. Hepatitis B test. HIV (human immunodeficiency virus) test. STI (sexually transmitted infection) testing, if you are at risk. Lung cancer screening. Colorectal cancer screening. Prostate cancer screening. Abdominal aortic aneurysm (AAA) screening. You may need this if you are a current or former smoker. Talk with your health care provider about your test results, treatment options, and if necessary, the need for more tests. Follow these instructions at home: Eating and drinking  Eat a diet that includes fresh fruits and vegetables, whole grains, lean protein, and low-fat dairy products. Limit your intake of foods with high amounts of sugar, saturated fats, and salt. Take vitamin and mineral supplements as recommended by your health care provider. Do not drink alcohol if your health care provider tells you not to drink. If you drink alcohol: Limit how much you have to 0-2 drinks a day. Know how much alcohol is in your drink. In the [place] one drink equals one 12 oz bottle of beer (355 mL), one 5 oz glass of wine (148 mL), or one 1 oz glass of hard liquor (44 mL). Lifestyle Brush your teeth every morning and night with fluoride toothpaste. Floss one time each day. Exercise for at least 30 minutes 5 or more days each week. Do not use any products that contain nicotine or tobacco. These products include cigarettes, chewing tobacco, and vaping devices, such as e-cigarettes. If you need help quitting, ask your health care provider. Do not use drugs. If you are sexually active, practice safe sex. Use a condom or  other form of protection to prevent STIs. Take aspirin only as told by your health care provider. Make sure that you understand how much to take and what form to take. Work with your health care provider to find  out whether it is safe and beneficial for you to take aspirin daily. Ask your health care provider if you need to take a cholesterol-lowering medicine (statin). Find healthy ways to manage stress, such as: Meditation, yoga, or listening to music. Journaling. Talking to a trusted person. Spending time with friends and family. Safety Always wear your seat belt while driving or riding in a vehicle. Do not drive: If you have been drinking alcohol. Do not ride with someone who has been drinking. When you are tired or distracted. While texting. If you have been using any mind-altering substances or drugs. Wear a helmet and other protective equipment during sports activities. If you have firearms in your house, make sure you follow all gun safety procedures. Minimize exposure to UV radiation to reduce your risk of skin cancer. What's next? Visit your health care provider once a year for an annual wellness visit. Ask your health care provider how often you should have your eyes and teeth checked. Stay up to date on all vaccines. This information is not intended to replace advice given to you by your health care provider. Make sure you discuss any questions you have with your health care provider. Document Revised: 10/19/2020 Document Reviewed: 10/19/2020 Elsevier Patient Education  2024 [employer].

## 2023-09-19 LAB — COMPREHENSIVE METABOLIC PANEL WITH GFR
ALT: 21 U/L (ref 0–53)
AST: 22 U/L (ref 0–37)
Albumin: 4.4 g/dL (ref 3.5–5.2)
Alkaline Phosphatase: 76 U/L (ref 39–117)
BUN: 18 mg/dL (ref 6–23)
CO2: 27 meq/L (ref 19–32)
Calcium: 9.3 mg/dL (ref 8.4–10.5)
Chloride: 104 meq/L (ref 96–112)
Creatinine, Ser: 1.04 mg/dL (ref 0.40–1.50)
GFR: 74.29 mL/min (ref >60.00)
Glucose, Bld: 90 mg/dL (ref 70–99)
Potassium: 4.3 meq/L (ref 3.5–5.1)
Sodium: 140 meq/L (ref 135–145)
Total Bilirubin: 0.4 mg/dL (ref 0.2–1.2)
Total Protein: 7.1 g/dL (ref 6.0–8.3)

## 2023-09-19 LAB — LIPID PANEL
Cholesterol: 163 mg/dL (ref 0–200)
HDL: 51.7 mg/dL (ref >39.00)
LDL Cholesterol: 88 mg/dL (ref 0–99)
NonHDL: 111.42
Total CHOL/HDL Ratio: 3
Triglycerides: 119 mg/dL (ref 0.0–149.0)
VLDL: 23.8 mg/dL (ref 0.0–40.0)

## 2023-09-19 LAB — HEMOGLOBIN A1C: Hgb A1c MFr Bld: 6.2 % (ref 4.6–6.5)

## 2023-09-19 LAB — PSA: PSA: 1.38 ng/mL (ref 0.10–4.00)

## 2023-09-21 ENCOUNTER — Encounter: Payer: Self-pay | Admitting: Family Medicine

## 2023-09-22 ENCOUNTER — Ambulatory Visit: Payer: Self-pay | Admitting: Family Medicine

## 2024-03-20 ENCOUNTER — Ambulatory Visit: Admitting: Family Medicine

## 2024-05-18 ENCOUNTER — Ambulatory Visit: Admitting: Family Medicine

## 2024-05-18 ENCOUNTER — Encounter: Payer: Self-pay | Admitting: Family Medicine

## 2024-05-18 VITALS — BP 126/70 | HR 79 | Temp 98.9°F | Resp 16 | Ht 66.25 in | Wt 209.4 lb

## 2024-05-18 DIAGNOSIS — R7303 Prediabetes: Secondary | ICD-10-CM | POA: Diagnosis not present

## 2024-05-18 DIAGNOSIS — F439 Reaction to severe stress, unspecified: Secondary | ICD-10-CM | POA: Diagnosis not present

## 2024-05-18 DIAGNOSIS — M722 Plantar fascial fibromatosis: Secondary | ICD-10-CM | POA: Diagnosis not present

## 2024-05-18 DIAGNOSIS — R12 Heartburn: Secondary | ICD-10-CM

## 2024-05-18 DIAGNOSIS — E785 Hyperlipidemia, unspecified: Secondary | ICD-10-CM | POA: Diagnosis not present

## 2024-05-18 DIAGNOSIS — J4530 Mild persistent asthma, uncomplicated: Secondary | ICD-10-CM

## 2024-05-18 DIAGNOSIS — H9319 Tinnitus, unspecified ear: Secondary | ICD-10-CM

## 2024-05-18 DIAGNOSIS — K219 Gastro-esophageal reflux disease without esophagitis: Secondary | ICD-10-CM

## 2024-05-18 LAB — COMPREHENSIVE METABOLIC PANEL WITH GFR
ALT: 18 U/L (ref 3–53)
AST: 22 U/L (ref 5–37)
Albumin: 4.4 g/dL (ref 3.5–5.2)
Alkaline Phosphatase: 66 U/L (ref 39–117)
BUN: 16 mg/dL (ref 6–23)
CO2: 28 meq/L (ref 19–32)
Calcium: 9.4 mg/dL (ref 8.4–10.5)
Chloride: 105 meq/L (ref 96–112)
Creatinine, Ser: 1.07 mg/dL (ref 0.40–1.50)
GFR: 71.46 mL/min
Glucose, Bld: 92 mg/dL (ref 70–99)
Potassium: 5 meq/L (ref 3.5–5.1)
Sodium: 139 meq/L (ref 135–145)
Total Bilirubin: 0.6 mg/dL (ref 0.2–1.2)
Total Protein: 7.1 g/dL (ref 6.0–8.3)

## 2024-05-18 LAB — LIPID PANEL
Cholesterol: 164 mg/dL (ref 28–200)
HDL: 55.9 mg/dL
LDL Cholesterol: 92 mg/dL (ref 10–99)
NonHDL: 108.3
Total CHOL/HDL Ratio: 3
Triglycerides: 81 mg/dL (ref 10.0–149.0)
VLDL: 16.2 mg/dL (ref 0.0–40.0)

## 2024-05-18 LAB — HEMOGLOBIN A1C: Hgb A1c MFr Bld: 6.2 % (ref 4.6–6.5)

## 2024-05-18 MED ORDER — FLUTICASONE-SALMETEROL 250-50 MCG/ACT IN AEPB: 1.0000 | INHALATION_SPRAY | Freq: Two times a day (BID) | RESPIRATORY_TRACT | 5 refills | Status: AC

## 2024-05-18 MED ORDER — PANTOPRAZOLE SODIUM 40 MG PO TBEC: 40.0000 mg | DELAYED_RELEASE_TABLET | Freq: Every day | ORAL | 3 refills | Status: AC

## 2024-05-18 MED ORDER — ATORVASTATIN CALCIUM 20 MG PO TABS: 20.0000 mg | ORAL_TABLET | Freq: Every day | ORAL | 1 refills | Status: AC

## 2024-05-18 NOTE — Progress Notes (Unsigned)
 "  Subjective:  Patient ID: Blake Nash, male    DOB: [birth year]  Age: 69 y.o. MRN: [id number]  CC:  Chief Complaint  Patient presents with   Follow-up    Daughter has colon cancer. She has surgery right after christmas. Wife is having seizures. He takes care of her. Patient is fatigued from the added hardship. Patient is still having ringing in his ears   Hyperlipidemia   Asthma   Foot Injury    Patient is having right heel pain. Inserts are helping. Put he thinks he has plantar fasciitis    HPI Blake Nash presents for follow-up.  Unfortunate developments with health of family as above.  Some added stress with daughter's diagnosis of colon cancer, recent surgery. Dtr diagnosed late last year, had chemo/radiation, then ultimately surgery with ostomy. Still caring for wife. He is doing ok with these stressors. Coping ok. Denies any needs at this time. Enjoying the socialization with dog classes.      05/18/2024    9:49 AM 09/18/2023    2:06 PM 09/12/2022    2:01 PM 05/14/2022   10:48 AM 11/16/2021    9:13 AM  Depression screen PHQ 2/9  Decreased Interest 0 1  0 1  Down, Depressed, Hopeless 0 0 0 0 1  PHQ - 2 Score 0 1 0 0 2  Altered sleeping 0 0 0 0 1  Tired, decreased energy 1 2 0 1 2  Change in appetite 0 1 0 0 2  Feeling bad or failure about yourself  0 0 0 0 0  Trouble concentrating 0 0 0 0 0  Moving slowly or fidgety/restless 0 0 0 0 0  Suicidal thoughts 0 0 0 0 0  PHQ-9 Score 1 4  0  1  7   Difficult doing work/chores Not difficult at all Somewhat difficult Not difficult at all       Data saved with a previous flowsheet row definition    Tinnitus Discussed in 2024.  Thought to be due to prior noise exposure with.  Masking sounds, handout given on treatment, riboflavin as an option but if not improving I did give him option of ENT or audiology.  He is still having similar symptoms. Some tmj issues that he plans on d/w dentist.  Prior audiologist eval 1.5-2 years ago, one  side was deficient, no hearing aids.   Right heel pain No known injury.  Noticed past few months. Has tried some over-the-counter foot inserts with some relief, massage and stretches.  Noticed after walking outside for awhile and standing up or first step in the morning. 12k steps per day.   Hyperlipidemia: Lipitor 20 mg daily, no new myalgias/side effects.  Lab Results  Component Value Date   CHOL 163 09/18/2023   HDL 51.70 09/18/2023   LDLCALC 88 09/18/2023   TRIG 119.0 09/18/2023   CHOLHDL 3 09/18/2023   Lab Results  Component Value Date   ALT 21 09/18/2023   AST 22 09/18/2023   ALKPHOS 76 09/18/2023   BILITOT 0.4 09/18/2023   Asthma Treated with Wixela 250/50 twice daily, working well. albuterol  as needed - only rare use past month few times. Weather related. Prior lung nodule with repeat imaging September 2024 with no change over 12 months, no further testing was indicated. Prevnar in 2023.   GERD Treated with Protonix  40 mg daily with certain trigger foods or timing of meals. - not needing as much.  Pepcid at night helpful.  Stable symptom control.  Prediabetes: Diet/exercise approach.  Weight has increased since last May. Staying active. Was wearing jacket for weight today initially.  Lab Results  Component Value Date   HGBA1C 6.2 09/18/2023   Wt Readings from Last 3 Encounters:  05/18/24 209 lb 6.4 oz (95 kg)  09/18/23 200 lb 6.4 oz (90.9 kg)  03/21/23 203 lb 9.6 oz (92.4 kg)    History Patient Active Problem List   Diagnosis Date Noted   Asthma 01/29/2012   Allergic rhinitis 01/29/2012   Past Medical History:  Diagnosis Date   Allergy    Asthma    Depression    minor- resolved per pt    GERD (gastroesophageal reflux disease)    occasionally with diet changes    History of blood transfusion    1990's   Hyperlipidemia    Past Surgical History:  Procedure Laterality Date   COLONOSCOPY     FRACTURE SURGERY     HERNIA REPAIR     PILONIDAL CYST  EXCISION  1980   POLYPECTOMY     VASECTOMY     Allergies[1] Prior to Admission medications  Medication Sig Start Date End Date Taking? Authorizing Provider  albuterol  (VENTOLIN  HFA) 108 (90 Base) MCG/ACT inhaler INHALE 1 TO 2 PUFFS INTO THE LUNGS EVERY 6 HOURS AS NEEDED 07/16/22  Yes Levora Reyes SAUNDERS, MD  atorvastatin  (LIPITOR) 20 MG tablet Take 1 tablet (20 mg total) by mouth daily. 09/18/23  Yes Levora Reyes SAUNDERS, MD  famotidine (PEPCID) 20 MG tablet Take 20 mg by mouth at bedtime.   Yes [provider]  fluticasone -salmeterol (WIXELA INHUB) 250-50 MCG/ACT AEPB Inhale 1 puff into the lungs 2 (two) times daily. in the morning and at bedtime. 09/18/23  Yes Levora Reyes SAUNDERS, MD  Multiple Vitamin (MULTIVITAMIN) tablet Take 1 tablet by mouth daily. Over 50+ daily   Yes [provider]  pantoprazole  (PROTONIX ) 40 MG tablet Take 1 tablet (40 mg total) by mouth daily. 09/18/23  Yes Levora Reyes SAUNDERS, MD   Social History   Socioeconomic History   Marital status: Married    Spouse name: Not on file   Number of children: Not on file   Years of education: Not on file   Highest education level: Not on file  Occupational History   Not on file  Tobacco Use   Smoking status: Former    Types: Cigarettes   Smokeless tobacco: Never   Tobacco comments:    quit at age 60  Substance and Sexual Activity   Alcohol use: Yes    Comment: occ   Drug use: No   Sexual activity: Not Currently  Other Topics Concern   Not on file  Social History Narrative   Marital status: married      Children: 2 children; +grandchildren 4      Lives:  With wife.      Employment:  Working; engineer, agricultural businesses      Tobacco: quit age 82      Alcohol: socially      Exercise: walks dogs three times per day; [place]. 1.5 miles per day.  Training on the farm on weekends.   Social Drivers of Health   Tobacco Use: Medium Risk (09/21/2023)   Patient History    Smoking Tobacco Use: Former    Smokeless  Tobacco Use: Never    Passive Exposure: Not on Actuary Strain: Not on file  Food Insecurity: Not on file  Transportation Needs: Not  on file  Physical Activity: Not on file  Stress: Not on file  Social Connections: Not on file  Intimate Partner Violence: Not on file  Depression (PHQ2-9): Low Risk (05/18/2024)   Depression (PHQ2-9)    PHQ-2 Score: 1  Alcohol Screen: Low Risk (06/12/2021)   Alcohol Screen    Last Alcohol Screening Score (AUDIT): 0  Housing: Not on file  Utilities: Not on file  Health Literacy: Not on file    Review of Systems  Constitutional:  Negative for fatigue and unexpected weight change.  Eyes:  Negative for visual disturbance.  Respiratory:  Negative for cough, chest tightness and shortness of breath.   Cardiovascular:  Negative for chest pain, palpitations and leg swelling.  Gastrointestinal:  Negative for abdominal pain and blood in stool.  Neurological:  Negative for dizziness, light-headedness and headaches.    Objective:   Vitals:   05/18/24 0946  BP: 126/70  Pulse: 79  Resp: 16  Temp: 98.9 F (37.2 C)  TempSrc: Temporal  SpO2: 98%  Weight: 209 lb 6.4 oz (95 kg)  Height: 5' 6.25 (1.683 m)     Physical Exam Vitals reviewed.  Constitutional:      Appearance: He is well-developed.  HENT:     Head: Normocephalic and atraumatic.  Neck:     Vascular: No carotid bruit or JVD.  Cardiovascular:     Rate and Rhythm: Normal rate and regular rhythm.     Heart sounds: Normal heart sounds. No murmur heard. Pulmonary:     Effort: Pulmonary effort is normal.     Breath sounds: Normal breath sounds. No rales.  Musculoskeletal:     Right lower leg: No edema.     Left lower leg: No edema.     Comments: R foot -negative lateral heel squeeze.  Reproducible discomfort at the proximal plantar fascia, no defect.  No swelling, no erythema.  Skin:    General: Skin is warm and dry.  Neurological:     Mental Status: He is alert and  oriented to person, place, and time.  Psychiatric:        Mood and Affect: Mood normal.       Assessment & Plan:  Blake Nash is a 69 y.o. male . Prediabetes - Plan: Hemoglobin A1c  Hyperlipidemia, unspecified hyperlipidemia type - Plan: Comprehensive metabolic panel with GFR, Lipid panel, atorvastatin  (LIPITOR) 20 MG tablet  Mild persistent asthma without complication - Plan: fluticasone -salmeterol (WIXELA INHUB) 250-50 MCG/ACT AEPB  Tinnitus, unspecified laterality  Gastroesophageal reflux disease, unspecified whether esophagitis present  Situational stress  Heartburn - Plan: pantoprazole  (PROTONIX ) 40 MG tablet   Meds ordered this encounter  Medications   atorvastatin  (LIPITOR) 20 MG tablet    Sig: Take 1 tablet (20 mg total) by mouth daily.    Dispense:  90 tablet    Refill:  1   fluticasone -salmeterol (WIXELA INHUB) 250-50 MCG/ACT AEPB    Sig: Inhale 1 puff into the lungs 2 (two) times daily. in the morning and at bedtime.    Dispense:  60 each    Refill:  5   pantoprazole  (PROTONIX ) 40 MG tablet    Sig: Take 1 tablet (40 mg total) by mouth daily.    Dispense:  30 tablet    Refill:  3   Patient Instructions  Sorry to hear about the family stressors, and health challenges.  I would consider follow up with audiology with persistent tinnitus to see if hearing aids may help with ringing as  well.  See info on plantar fasciitis. Continue stretching, inserts and let me know if not improving in next month or two. Sooner if worse.  Thank you for coming in today. No change in medications at this time. If there are any concerns on your bloodwork, I will let you know. Take care!     Plantar Fasciitis: What to Know  Your plantar fascia is a band of thick tissue on the bottom of your foot. It connects your heel bone to the base of your toes. If the fascia gets irritated, it can cause pain in your heel or foot. This is called plantar fasciitis. In some cases, plantar  fasciitis can make it hard for you to walk or move. The pain is often worse in the morning after sleeping, or after sitting or lying down for a long time. Pain may also be worse after walking or standing for a long time. What are the causes? Plantar fasciitis may be caused by: Standing for a long time. Wearing shoes that don't have good arch support. Doing high-impact activities. These are things that put stress on your joints. They include: Ballet. Aerobic exercises. These are exercises that make your heart beat faster. Being overweight. Having a way of walking, or gait, that isn't normal. Tight muscles in your calf, which is in the back of your lower leg. High arches in your feet, or flat feet. Starting a new sport or activity. What are the signs or symptoms? The main symptom of plantar fasciitis is heel pain. Your pain may get worse after: You take your first steps after a time of rest. This includes in the morning after you wake up, or after you've been sitting or lying down for a while. Standing still for a long time. Pain may lessen after 30-45 minutes of activity, such as gentle walking. How is this diagnosed? Plantar fasciitis may be diagnosed based on your medical history, your symptoms, and an exam. Your health care provider will check for: A tender spot on the bottom of your foot. A high arch in your foot, or flat feet. Pain when you move your foot. Trouble moving your foot. You may also have tests. These may include: X-rays. Ultrasound. MRI. How is this treated? Treatment depends on how bad your plantar fasciitis is. It may include: RICE therapy. This stands for rest, ice, pressure (compression), and raising (elevating) the foot. Exercises to stretch your calves and plantar fascia. A night splint. This holds your foot in a stretched, upward position while you sleep. Physical therapy. This can help with symptoms. It can also prevent problems in the future. Shots of a  steroid medicine called cortisone. This can help with pain and irritation. Extracorporeal shock wave therapy. This uses electric shocks to stimulate your plantar fascia. If other treatments don't help, you may need to have surgery. Follow these instructions at home: Managing pain, stiffness, and swelling  Use ice, an ice pack, or a frozen bottle of water as told. Place a towel between your skin and the ice. Roll the bottom of your foot over the ice or frozen bottle. Do this for 20 minutes, 2-3 times a day. If your skin turns red, take off the ice right away to prevent skin damage. The risk of damage is higher if you can't feel pain, heat, or cold. Wear shoes that have air-sole or gel-sole cushions. You could also try soft shoe inserts made for plantar fasciitis. Activity Try not to do things that cause pain. Ask  what things are safe for you to do. Exercise as told. Try activities that are low impact. This means that they're easier on your joints. They include: Swimming. Water aerobics. Biking. General instructions Take your medicines only as told. Wear a night splint as told. Loosen the splint if your toes tingle, are numb, or turn cold and blue. Stay at a healthy weight. Work with your provider to lose weight as needed. Contact a health care provider if: Your symptoms don't go away with treatment. You have pain that gets worse. Your pain makes it hard to move or do everyday things. This information is not intended to replace advice given to you by your health care provider. Make sure you discuss any questions you have with your health care provider. Document Revised: 09/24/2022 Document Reviewed: 09/24/2022 Elsevier Patient Education  2024 [employer]     Signed,   Reyes Pines, MD [facility], [facility] 05/18/2024 10:33 AM       [1]  Allergies Allergen Reactions   Gentamycin [Gentamicin] Other (See Comments)    Eye  drop   "

## 2024-05-18 NOTE — Patient Instructions (Addendum)
 Sorry to hear about the family stressors, and health challenges.  I would consider follow up with audiology with persistent tinnitus to see if hearing aids may help with ringing as well.  See info on plantar fasciitis. Continue stretching, inserts and let me know if not improving in next month or two. Sooner if worse.  Thank you for coming in today. No change in medications at this time. If there are any concerns on your bloodwork, I will let you know. Take care!     Plantar Fasciitis: What to Know  Your plantar fascia is a band of thick tissue on the bottom of your foot. It connects your heel bone to the base of your toes. If the fascia gets irritated, it can cause pain in your heel or foot. This is called plantar fasciitis. In some cases, plantar fasciitis can make it hard for you to walk or move. The pain is often worse in the morning after sleeping, or after sitting or lying down for a long time. Pain may also be worse after walking or standing for a long time. What are the causes? Plantar fasciitis may be caused by: Standing for a long time. Wearing shoes that don't have good arch support. Doing high-impact activities. These are things that put stress on your joints. They include: Ballet. Aerobic exercises. These are exercises that make your heart beat faster. Being overweight. Having a way of walking, or gait, that isn't normal. Tight muscles in your calf, which is in the back of your lower leg. High arches in your feet, or flat feet. Starting a new sport or activity. What are the signs or symptoms? The main symptom of plantar fasciitis is heel pain. Your pain may get worse after: You take your first steps after a time of rest. This includes in the morning after you wake up, or after you've been sitting or lying down for a while. Standing still for a long time. Pain may lessen after 30-45 minutes of activity, such as gentle walking. How is this diagnosed? Plantar fasciitis may be  diagnosed based on your medical history, your symptoms, and an exam. Your health care provider will check for: A tender spot on the bottom of your foot. A high arch in your foot, or flat feet. Pain when you move your foot. Trouble moving your foot. You may also have tests. These may include: X-rays. Ultrasound. MRI. How is this treated? Treatment depends on how bad your plantar fasciitis is. It may include: RICE therapy. This stands for rest, ice, pressure (compression), and raising (elevating) the foot. Exercises to stretch your calves and plantar fascia. A night splint. This holds your foot in a stretched, upward position while you sleep. Physical therapy. This can help with symptoms. It can also prevent problems in the future. Shots of a steroid medicine called cortisone. This can help with pain and irritation. Extracorporeal shock wave therapy. This uses electric shocks to stimulate your plantar fascia. If other treatments don't help, you may need to have surgery. Follow these instructions at home: Managing pain, stiffness, and swelling  Use ice, an ice pack, or a frozen bottle of water as told. Place a towel between your skin and the ice. Roll the bottom of your foot over the ice or frozen bottle. Do this for 20 minutes, 2-3 times a day. If your skin turns red, take off the ice right away to prevent skin damage. The risk of damage is higher if you can't feel pain, heat, or  cold. Wear shoes that have air-sole or gel-sole cushions. You could also try soft shoe inserts made for plantar fasciitis. Activity Try not to do things that cause pain. Ask what things are safe for you to do. Exercise as told. Try activities that are low impact. This means that they're easier on your joints. They include: Swimming. Water aerobics. Biking. General instructions Take your medicines only as told. Wear a night splint as told. Loosen the splint if your toes tingle, are numb, or turn cold and  blue. Stay at a healthy weight. Work with your provider to lose weight as needed. Contact a health care provider if: Your symptoms don't go away with treatment. You have pain that gets worse. Your pain makes it hard to move or do everyday things. This information is not intended to replace advice given to you by your health care provider. Make sure you discuss any questions you have with your health care provider. Document Revised: 09/24/2022 Document Reviewed: 09/24/2022 Elsevier Patient Education  2024 [employer].

## 2024-05-19 ENCOUNTER — Ambulatory Visit: Payer: Self-pay | Admitting: Family Medicine

## 2024-05-25 NOTE — Progress Notes (Signed)
 Lab results have been discussed.   Verbalized understanding? Yes  Are there any questions? No

## 2024-11-19 ENCOUNTER — Encounter: Admitting: Family Medicine
